# Patient Record
Sex: Male | Born: 1950
Health system: Southern US, Community
[De-identification: ages and names within clinical notes are randomized; demographics above are authoritative.]

## PROBLEM LIST (undated history)

## (undated) DIAGNOSIS — H269 Unspecified cataract: Secondary | ICD-10-CM

## (undated) DIAGNOSIS — M1990 Unspecified osteoarthritis, unspecified site: Secondary | ICD-10-CM

## (undated) DIAGNOSIS — C61 Malignant neoplasm of prostate: Secondary | ICD-10-CM

## (undated) DIAGNOSIS — E785 Hyperlipidemia, unspecified: Secondary | ICD-10-CM

## (undated) DIAGNOSIS — N390 Urinary tract infection, site not specified: Secondary | ICD-10-CM

## (undated) DIAGNOSIS — K219 Gastro-esophageal reflux disease without esophagitis: Secondary | ICD-10-CM

## (undated) DIAGNOSIS — C4490 Unspecified malignant neoplasm of skin, unspecified: Secondary | ICD-10-CM

## (undated) HISTORY — DX: Urinary tract infection, site not specified: N39.0

## (undated) HISTORY — PX: JOINT REPLACEMENT: SHX530

## (undated) HISTORY — DX: Unspecified cataract: H26.9

## (undated) HISTORY — PX: OTHER SURGICAL HISTORY: SHX169

## (undated) HISTORY — DX: Hyperlipidemia, unspecified: E78.5

## (undated) HISTORY — PX: REPLACEMENT TOTAL KNEE: SUR1224

## (undated) HISTORY — PX: EYE SURGERY: SHX253

---

## 2005-03-22 ENCOUNTER — Ambulatory Visit: Payer: Self-pay | Admitting: Family Medicine

## 2005-04-11 ENCOUNTER — Ambulatory Visit: Payer: Self-pay | Admitting: Family Medicine

## 2006-09-25 ENCOUNTER — Ambulatory Visit: Payer: Self-pay | Admitting: Family Medicine

## 2006-09-26 DIAGNOSIS — R05 Cough: Secondary | ICD-10-CM

## 2006-09-26 DIAGNOSIS — R059 Cough, unspecified: Secondary | ICD-10-CM | POA: Insufficient documentation

## 2007-06-02 ENCOUNTER — Ambulatory Visit: Payer: Self-pay | Admitting: Family Medicine

## 2007-06-02 LAB — CONVERTED CEMR LAB
ALT: 18 units/L (ref 0–53)
AST: 27 units/L (ref 0–37)
Albumin: 4.4 g/dL (ref 3.5–5.2)
Alkaline Phosphatase: 32 units/L — ABNORMAL LOW (ref 39–117)
BUN: 15 mg/dL (ref 6–23)
Basophils Relative: 0.4 % (ref 0.0–1.0)
CO2: 29 meq/L (ref 19–32)
Chloride: 102 meq/L (ref 96–112)
Creatinine, Ser: 0.9 mg/dL (ref 0.4–1.5)
Eosinophils Absolute: 0.2 10*3/uL (ref 0.0–0.7)
Eosinophils Relative: 4.8 % (ref 0.0–5.0)
GFR calc non Af Amer: 93 mL/min
Glucose, Bld: 99 mg/dL (ref 70–99)
Glucose, Urine, Semiquant: NEGATIVE
MCV: 93.2 fL (ref 78.0–100.0)
Monocytes Relative: 9.8 % (ref 3.0–12.0)
Neutrophils Relative %: 58.3 % (ref 43.0–77.0)
Nitrite: NEGATIVE
PSA: 1.03 ng/mL (ref 0.10–4.00)
Potassium: 5.2 meq/L — ABNORMAL HIGH (ref 3.5–5.1)
Protein, U semiquant: NEGATIVE
RBC: 4.73 M/uL (ref 4.22–5.81)
Specific Gravity, Urine: 1.01
Total CHOL/HDL Ratio: 3.6
Total Protein: 7 g/dL (ref 6.0–8.3)
VLDL: 14 mg/dL (ref 0–40)
WBC Urine, dipstick: NEGATIVE
WBC: 3.6 10*3/uL — ABNORMAL LOW (ref 4.5–10.5)
pH: 6.5

## 2007-06-10 ENCOUNTER — Ambulatory Visit: Payer: Self-pay | Admitting: Family Medicine

## 2008-02-20 HISTORY — PX: COLONOSCOPY: SHX174

## 2008-03-30 ENCOUNTER — Ambulatory Visit: Payer: Self-pay | Admitting: Gastroenterology

## 2008-04-21 ENCOUNTER — Ambulatory Visit: Payer: Self-pay | Admitting: Gastroenterology

## 2009-03-16 ENCOUNTER — Telehealth (INDEPENDENT_AMBULATORY_CARE_PROVIDER_SITE_OTHER): Payer: Self-pay | Admitting: *Deleted

## 2009-03-16 ENCOUNTER — Ambulatory Visit: Payer: Self-pay | Admitting: Cardiology

## 2009-03-16 ENCOUNTER — Ambulatory Visit: Payer: Self-pay | Admitting: Family Medicine

## 2009-03-16 DIAGNOSIS — R079 Chest pain, unspecified: Secondary | ICD-10-CM | POA: Insufficient documentation

## 2009-03-17 ENCOUNTER — Ambulatory Visit: Payer: Self-pay | Admitting: Cardiology

## 2009-03-17 ENCOUNTER — Encounter: Payer: Self-pay | Admitting: Internal Medicine

## 2009-03-17 ENCOUNTER — Encounter (HOSPITAL_COMMUNITY): Admission: RE | Admit: 2009-03-17 | Discharge: 2009-05-25 | Payer: Self-pay | Admitting: Cardiology

## 2009-03-17 ENCOUNTER — Ambulatory Visit: Payer: Self-pay

## 2010-03-22 NOTE — Assessment & Plan Note (Signed)
Summary: ? muscle strain chest from exercise?/dm   Vital Signs:  Patient profile:   60 year old male Weight:      176 pounds BMI:     25.16 Temp:     99.1 degrees F oral BP sitting:   110 / 80  (left arm) Cuff size:   regular  Vitals Entered By: Kern Reap CMA Duncan Dull) (March 16, 2009 8:29 AM)  Reason for Visit chest pain  History of Present Illness: Oscar Valencia is a 60 year old, married male, nonsmoker, who comes in today for evaluation of chest pain.  On December the 22nd he was walking playing golf.  On the second hole, which had a sharp incline.  He developed the gradual onset of sharp to a pressure-type midsternal chest pain.  He putted  out, rested for awhile.  The discomfort would not abate therefore, he walked back to the clubhouse.  He rested in the clubhouse for about 20 minutes and the discomfort went away.  He did have some nausea and diaphoresis.  No radiation.  he then went out and played 16 holes of golf walking with no chest pain  He had a second episode of couple weeks ago.  It is not recall the details of and a third episode this past Monday.  The episode on Monday occurred while he was sitting in the morning, having a bowel movement.  It was the same type of discomfort.  Sharp to dull midsternal nonradiating associate with some nausea and diaphoresis.  That episode lasted again 20 minutes and went away.  he exercises on a regular basis.  Last year.  He walked 200 rounds of golf.  He said no history of high blood pressure, diabetes, or lipid abnormalities.  Family history negative for coronary disease.  His mother died of melanoma.  His father is 30 has had hypertension, gallbladder removal, and esophageal stricture, older brother in good health.  Two younger sisters in good health.  He's also had difficulty swallowing.  He states in the morning.  He takes a baby aspirin a multivitamin and glucosamine.  He had to stop the glucosamine about 6 months ago because was getting  stuck in esophagus he had a similar episode to the above 25 years ago in [place], [place].  At that time, he was admitted the hospital for observation EKGs, serial enzymes were negative.  He was signed out as chest wall pain     Allergies: 1)  ! Penicillin  Past History:  Past medical, surgical, family and social histories (including risk factors) reviewed, and no changes noted (except as noted below).  Past Medical History: Reviewed history from 06/10/2007 and no changes required. Unremarkable  Family History: Reviewed history from 06/10/2007 and no changes required. father had a history of gallbladder disease, hypertension, and prostate cancer mother had a history of melanoma lymphoma, and a renal cell carcinoma.  One brother in good health.  Two sisters in good health  Social History: Reviewed history from 06/10/2007 and no changes required. Retired Married Never Smoked Alcohol use-no Drug use-no Regular exercise-yes  Review of Systems      See HPI  Physical Exam  General:  Well-developed,well-nourished,in no acute distress; alert,appropriate and cooperative throughout examination Neck:  No deformities, masses, or tenderness noted. Chest Wall:  No deformities, masses, tenderness or gynecomastia noted. Lungs:  Normal respiratory effort, chest expands symmetrically. Lungs are clear to auscultation, no crackles or wheezes. Heart:  Normal rate and regular rhythm. S1 and S2 normal without gallop,  murmur, click, rub or other extra sounds.   Impression & Recommendations:  Problem # 1:  CHEST PAIN, EXERTIONAL (ICD-786.50) Assessment New  Orders: Cardiology Referral (Cardiology)  Complete Medication List: 1)  Adult Aspirin Low Strength 81 Mg Tbdp (Aspirin) .... One by mouth daily 2)  Cvs Ibuprofen 200 Mg Caps (Ibuprofen) .... Prn  Patient Instructions: 1)  I talk with Dr. Leonard Schwartz...........Marland Kitchen be at his office at 1215 today. 2)  If your cardiac evaluation  is normal then  we will request a GI consult to evaluate the dysphasia  Appended Document: ? muscle strain chest from exercise?/dm     Allergies: 1)  ! Penicillin   Complete Medication List: 1)  Adult Aspirin Low Strength 81 Mg Tbdp (Aspirin) .... One by mouth daily 2)  Cvs Ibuprofen 200 Mg Caps (Ibuprofen) .... Prn  Other Orders: EKG w/ Interpretation (93000)

## 2010-03-22 NOTE — Assessment & Plan Note (Signed)
Summary: Cardiology Nuclear Study  Nuclear Med Background Indications for Stress Test: Evaluation for Ischemia     Symptoms: Chest Tightness, Chest Tightness with Exertion, Diaphoresis, Nausea    Nuclear Pre-Procedure Caffeine/Decaff Intake: None NPO After: 8:00 PM Lungs: clear IV 0.9% NS with Angio Cath: 22g     IV Site: (R) AC IV Started by: Irean Hong RN Chest Size (in) 41     Height (in): 70.25 Weight (lb): 170 BMI: 24.31  Nuclear Med Study 1 or 2 day study:  1 day     Stress Test Type:  Stress Reading MD:  Olga Millers, MD     Referring MD:  B.Brodie Resting Radionuclide:  Technetium 30m Tetrofosmin     Resting Radionuclide Dose:  10.8 mCi  Stress Radionuclide:  Technetium 64m Tetrofosmin     Stress Radionuclide Dose:  33.0 mCi   Stress Protocol Exercise Time (min):  4:00 min     Max HR:  141 bpm     Predicted Max HR:  163 bpm  Max Systolic BP: 206 mm Hg     Percent Max HR:  86.50 %     METS: 13.4 Rate Pressure Product:  16109    Stress Test Technologist:  Milana Na EMT-P     Nuclear Technologist:  Burna Mortimer Deal RT-N  Rest Procedure  Myocardial perfusion imaging was performed at rest 45 minutes following the intravenous administration of Myoview Technetium 48m Tetrofosmin.  Stress Procedure  The patient exercised for 11:00. The patient stopped due to fatigue and denied any chest pain.  There were non specific T wave changes.  Myoview was injected at peak exercise and myocardial perfusion imaging was performed after a brief delay.  QPS Raw Data Images:  There is no interference from other nuclear activity. Stress Images:  There is normal uptake in all areas. Rest Images:  Normal homogeneous uptake in all areas of the myocardium. Subtraction (SDS):  No evidence of ischemia. Transient Ischemic Dilatation:  .93  (Normal <1.22)  Lung/Heart Ratio:  .35  (Normal <0.45)  Quantitative Gated Spect Images QGS EDV:  125 ml QGS ESV:  61 ml QGS EF:  51 % QGS cine  images:  Normal wall motion; mild LVE.   Overall Impression  Exercise Capacity: Excellent exercise capacity. BP Response: Normal blood pressure response. Clinical Symptoms: No chest pain ECG Impression: No significant ST segment change suggestive of ischemia. Overall Impression: There is no sign of scar or ischemia.  Appended Document: Cardiology Nuclear Beather Arbour, I called him and told him ok. He will f/u with Dr Tawanna Cooler. BB

## 2010-03-22 NOTE — Progress Notes (Signed)
Summary: Nuclear pre procedure  Phone Note Outgoing Call Call back at [facility] Phone [phone]   Call placed by: Rea College, CMA,  March 16, 2009 2:48 PM Call placed to: Patient Summary of Call: Reviewed information on Myoview Information Sheet (see scanned document for further details).  Spoke with patient.      Nuclear Med Background Indications for Stress Test: Evaluation for Ischemia     Symptoms: Chest Tightness, Chest Tightness with Exertion, Diaphoresis, Nausea    Nuclear Pre-Procedure Height (in): 70.25

## 2010-03-22 NOTE — Assessment & Plan Note (Signed)
Summary: np6   Visit Type:  Initial Consult Primary Provider:  Roderick Pee MD  CC:  chest pain.  History of Present Illness: The patient is 60 years old and is referred by Dr. Tawanna Cooler for evaluation of chest pain. He has had 3 episodes of chest pain over the past one to 2 months. One of his episodes occurred while he was walking up a hill playing golf. He described the pain as a tightness in his chest and this became worse and was associated with some nausea and diaphoresis. He had to stop playing golf and go back to the clubhouse and the symptoms finally resolved. He had another episode earlier this week where he woke up and felt tightness in his chest with a feeling of clamminess.  He saw his primary care physician Dr. Tawanna Cooler today who was concerned that his symptoms may represent angina and referred him urgently for consultation.  He has no history hypertension or diabetes.  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 One By Mouth Daily 2)  Cvs Ibuprofen 200 Mg  Caps (Ibuprofen) .... Prn  Allergies (verified): 1)  ! Penicillin  Past History:  Past Medical History: Reviewed history from 06/10/2007 and no changes required. Unremarkable  Family History: Reviewed history from 06/10/2007 and no changes required. father had a history of gallbladder disease, hypertension, and prostate cancer mother had a history of melanoma lymphoma, and a renal cell carcinoma.  One brother in good health.  Two sisters in good health  Social History: Reviewed history from 06/10/2007 and no changes required. Retired Married Never Smoked Alcohol use-no Drug use-no Regular exercise-yes  Review of Systems       ROS is negative except as outlined in HPI.   Vital Signs:  Patient profile:   60 year old male Height:      70.25  inches Weight:      175 pounds BMI:     25.02 Pulse rate:   52 / minute BP sitting:   118 / 76  (left arm) Cuff size:   regular  Vitals Entered By: Burnett Kanaris, CNA (March 16, 2009 12:26 PM)  Physical Exam  Additional Exam:  Gen. Well-nourished, in no distress Head: Normocephalic    Eyes: PERRLA/EOM intact; conjunctiva and lids normal Neck: No JVD, thyroid not enlarged, no carotid bruits Lungs: No tachypnea, clear w/o rales, rhonchi or wheezes CV: Rhythm regular, PMI not displaced,  sounds  nl, no murmurs or gallops, no edema, pulses nl UE and LE Abd: BS nl, abd soft and non-tender w/o masses or hepatosplenomegaly MS: No deformities Neuro: no focal sns Skin: no lesions Psych: nl affect    Impression & Recommendations:  Problem # 1:  CHEST PAIN, EXERTIONAL (ICD-786.50) The symptoms of chest pain are a little worrisome for ischemia although they have not been consistent. Some of them have been related to exertion and some have not. He said other times where he's had exertion and no symptoms. His risk profile is not high. He has no history of hypertension diabetes or hyperlipidemia or family history of coronary heart disease. However his symptoms are worrisome enough that I think he should have further evaluation. We have arranged for him to have an exercise rest rest Myoview scan.  If the scan is positive then he'll need further evaluation. If the  scan is negative and probably refer him back to [facility] for consideration for treatment of possible reflux versus musculoskeletal pain. Orders: Nuclear Stress Test (Nuc Stress Test)  Patient Instructions: 1)  Your physician has requested that you have an exercise stress myoview.  For further information please visit [email or web address]  Please follow instruction sheet, as given. 2)  We will see you back on an as needed basis as long as your stress test is normal.  3)  Hold aspirin and ibuprofen for now.  Prevention & Chronic  Care Immunizations   Influenza vaccine: Not documented    Tetanus booster: Not documented    Pneumococcal vaccine: Not documented  Colorectal Screening   Hemoccult: Not documented    Colonoscopy: Location:  [facility].    (04/21/2008)   Colonoscopy due: 04/2018  Other Screening   PSA: 1.03  (06/02/2007)   Smoking status: never  (06/10/2007)  Lipids   Total Cholesterol: 199  (06/02/2007)   LDL: 130  (06/02/2007)   LDL Direct: Not documented   HDL: 54.6  (06/02/2007)   Triglycerides: 70  (06/02/2007)

## 2015-05-17 ENCOUNTER — Encounter: Payer: Self-pay | Admitting: Gastroenterology

## 2016-06-22 DIAGNOSIS — E784 Other hyperlipidemia: Secondary | ICD-10-CM | POA: Diagnosis not present

## 2016-07-05 DIAGNOSIS — M179 Osteoarthritis of knee, unspecified: Secondary | ICD-10-CM | POA: Diagnosis not present

## 2016-07-05 DIAGNOSIS — Z0000 Encounter for general adult medical examination without abnormal findings: Secondary | ICD-10-CM | POA: Diagnosis not present

## 2016-07-05 DIAGNOSIS — R030 Elevated blood-pressure reading, without diagnosis of hypertension: Secondary | ICD-10-CM | POA: Diagnosis not present

## 2016-07-05 DIAGNOSIS — E784 Other hyperlipidemia: Secondary | ICD-10-CM | POA: Diagnosis not present

## 2016-07-06 ENCOUNTER — Other Ambulatory Visit: Payer: Self-pay | Admitting: Internal Medicine

## 2016-07-06 DIAGNOSIS — Z87891 Personal history of nicotine dependence: Secondary | ICD-10-CM

## 2016-07-10 DIAGNOSIS — Z1212 Encounter for screening for malignant neoplasm of rectum: Secondary | ICD-10-CM | POA: Diagnosis not present

## 2016-07-17 ENCOUNTER — Ambulatory Visit: Payer: Self-pay

## 2016-07-18 DIAGNOSIS — H5213 Myopia, bilateral: Secondary | ICD-10-CM | POA: Diagnosis not present

## 2016-07-19 ENCOUNTER — Ambulatory Visit
Admission: RE | Admit: 2016-07-19 | Discharge: 2016-07-19 | Disposition: A | Discharge status: Home / Self Care | Payer: Medicare Other | Source: Ambulatory Visit | Attending: Internal Medicine | Admitting: Internal Medicine

## 2016-07-19 DIAGNOSIS — Z87891 Personal history of nicotine dependence: Secondary | ICD-10-CM | POA: Diagnosis not present

## 2016-07-19 DIAGNOSIS — Z136 Encounter for screening for cardiovascular disorders: Secondary | ICD-10-CM | POA: Diagnosis not present

## 2016-12-15 DIAGNOSIS — Z23 Encounter for immunization: Secondary | ICD-10-CM | POA: Diagnosis not present

## 2016-12-25 DIAGNOSIS — M25562 Pain in left knee: Secondary | ICD-10-CM | POA: Diagnosis not present

## 2016-12-25 DIAGNOSIS — M25561 Pain in right knee: Secondary | ICD-10-CM | POA: Diagnosis not present

## 2016-12-25 DIAGNOSIS — M1712 Unilateral primary osteoarthritis, left knee: Secondary | ICD-10-CM | POA: Diagnosis not present

## 2017-01-02 DIAGNOSIS — M79645 Pain in left finger(s): Secondary | ICD-10-CM | POA: Diagnosis not present

## 2017-01-02 DIAGNOSIS — S62635A Displaced fracture of distal phalanx of left ring finger, initial encounter for closed fracture: Secondary | ICD-10-CM | POA: Diagnosis not present

## 2017-02-04 DIAGNOSIS — M25562 Pain in left knee: Secondary | ICD-10-CM | POA: Diagnosis not present

## 2017-02-04 DIAGNOSIS — M1712 Unilateral primary osteoarthritis, left knee: Secondary | ICD-10-CM | POA: Diagnosis not present

## 2017-03-04 DIAGNOSIS — D1801 Hemangioma of skin and subcutaneous tissue: Secondary | ICD-10-CM | POA: Diagnosis not present

## 2017-03-04 DIAGNOSIS — L821 Other seborrheic keratosis: Secondary | ICD-10-CM | POA: Diagnosis not present

## 2017-03-04 DIAGNOSIS — L814 Other melanin hyperpigmentation: Secondary | ICD-10-CM | POA: Diagnosis not present

## 2017-03-04 DIAGNOSIS — Z85828 Personal history of other malignant neoplasm of skin: Secondary | ICD-10-CM | POA: Diagnosis not present

## 2017-03-12 DIAGNOSIS — M19042 Primary osteoarthritis, left hand: Secondary | ICD-10-CM | POA: Diagnosis not present

## 2017-03-12 DIAGNOSIS — S62635K Displaced fracture of distal phalanx of left ring finger, subsequent encounter for fracture with nonunion: Secondary | ICD-10-CM | POA: Diagnosis not present

## 2017-03-27 DIAGNOSIS — S62635A Displaced fracture of distal phalanx of left ring finger, initial encounter for closed fracture: Secondary | ICD-10-CM | POA: Diagnosis not present

## 2017-03-27 DIAGNOSIS — S62635D Displaced fracture of distal phalanx of left ring finger, subsequent encounter for fracture with routine healing: Secondary | ICD-10-CM | POA: Diagnosis not present

## 2017-03-27 DIAGNOSIS — Z4789 Encounter for other orthopedic aftercare: Secondary | ICD-10-CM | POA: Diagnosis not present

## 2017-03-27 DIAGNOSIS — M25642 Stiffness of left hand, not elsewhere classified: Secondary | ICD-10-CM | POA: Diagnosis not present

## 2017-04-01 DIAGNOSIS — M25562 Pain in left knee: Secondary | ICD-10-CM | POA: Diagnosis not present

## 2017-04-01 DIAGNOSIS — M1712 Unilateral primary osteoarthritis, left knee: Secondary | ICD-10-CM | POA: Diagnosis not present

## 2017-04-10 DIAGNOSIS — Z4789 Encounter for other orthopedic aftercare: Secondary | ICD-10-CM | POA: Diagnosis not present

## 2017-04-10 DIAGNOSIS — S62635A Displaced fracture of distal phalanx of left ring finger, initial encounter for closed fracture: Secondary | ICD-10-CM | POA: Diagnosis not present

## 2017-05-08 DIAGNOSIS — M25562 Pain in left knee: Secondary | ICD-10-CM | POA: Diagnosis not present

## 2017-05-08 DIAGNOSIS — M1712 Unilateral primary osteoarthritis, left knee: Secondary | ICD-10-CM | POA: Diagnosis not present

## 2017-05-09 DIAGNOSIS — S62635D Displaced fracture of distal phalanx of left ring finger, subsequent encounter for fracture with routine healing: Secondary | ICD-10-CM | POA: Diagnosis not present

## 2017-05-09 DIAGNOSIS — Z4789 Encounter for other orthopedic aftercare: Secondary | ICD-10-CM | POA: Diagnosis not present

## 2017-05-15 DIAGNOSIS — M1712 Unilateral primary osteoarthritis, left knee: Secondary | ICD-10-CM | POA: Diagnosis not present

## 2017-06-24 DIAGNOSIS — H5213 Myopia, bilateral: Secondary | ICD-10-CM | POA: Diagnosis not present

## 2017-06-27 DIAGNOSIS — M1712 Unilateral primary osteoarthritis, left knee: Secondary | ICD-10-CM | POA: Diagnosis not present

## 2017-07-08 DIAGNOSIS — G8918 Other acute postprocedural pain: Secondary | ICD-10-CM | POA: Diagnosis not present

## 2017-07-08 DIAGNOSIS — M1712 Unilateral primary osteoarthritis, left knee: Secondary | ICD-10-CM | POA: Diagnosis not present

## 2017-07-11 DIAGNOSIS — M25562 Pain in left knee: Secondary | ICD-10-CM | POA: Diagnosis not present

## 2017-07-12 DIAGNOSIS — M25562 Pain in left knee: Secondary | ICD-10-CM | POA: Diagnosis not present

## 2017-07-16 DIAGNOSIS — M25562 Pain in left knee: Secondary | ICD-10-CM | POA: Diagnosis not present

## 2017-07-19 DIAGNOSIS — M25562 Pain in left knee: Secondary | ICD-10-CM | POA: Diagnosis not present

## 2017-07-22 DIAGNOSIS — M25562 Pain in left knee: Secondary | ICD-10-CM | POA: Diagnosis not present

## 2017-07-26 DIAGNOSIS — M25562 Pain in left knee: Secondary | ICD-10-CM | POA: Diagnosis not present

## 2017-07-26 DIAGNOSIS — Z125 Encounter for screening for malignant neoplasm of prostate: Secondary | ICD-10-CM | POA: Diagnosis not present

## 2017-07-26 DIAGNOSIS — R82998 Other abnormal findings in urine: Secondary | ICD-10-CM | POA: Diagnosis not present

## 2017-07-26 DIAGNOSIS — E7849 Other hyperlipidemia: Secondary | ICD-10-CM | POA: Diagnosis not present

## 2017-07-31 DIAGNOSIS — E7849 Other hyperlipidemia: Secondary | ICD-10-CM | POA: Diagnosis not present

## 2017-08-06 DIAGNOSIS — M1712 Unilateral primary osteoarthritis, left knee: Secondary | ICD-10-CM | POA: Diagnosis not present

## 2017-08-06 DIAGNOSIS — R269 Unspecified abnormalities of gait and mobility: Secondary | ICD-10-CM | POA: Diagnosis not present

## 2017-08-06 DIAGNOSIS — M6281 Muscle weakness (generalized): Secondary | ICD-10-CM | POA: Diagnosis not present

## 2017-08-13 DIAGNOSIS — M25562 Pain in left knee: Secondary | ICD-10-CM | POA: Diagnosis not present

## 2017-08-15 DIAGNOSIS — Z0000 Encounter for general adult medical examination without abnormal findings: Secondary | ICD-10-CM | POA: Diagnosis not present

## 2017-08-15 DIAGNOSIS — R030 Elevated blood-pressure reading, without diagnosis of hypertension: Secondary | ICD-10-CM | POA: Diagnosis not present

## 2017-08-15 DIAGNOSIS — R972 Elevated prostate specific antigen [PSA]: Secondary | ICD-10-CM | POA: Diagnosis not present

## 2017-08-15 DIAGNOSIS — E7849 Other hyperlipidemia: Secondary | ICD-10-CM | POA: Diagnosis not present

## 2017-08-16 DIAGNOSIS — Z1212 Encounter for screening for malignant neoplasm of rectum: Secondary | ICD-10-CM | POA: Diagnosis not present

## 2017-08-16 DIAGNOSIS — Z96652 Presence of left artificial knee joint: Secondary | ICD-10-CM | POA: Diagnosis not present

## 2017-08-16 DIAGNOSIS — Z471 Aftercare following joint replacement surgery: Secondary | ICD-10-CM | POA: Diagnosis not present

## 2017-10-01 DIAGNOSIS — R972 Elevated prostate specific antigen [PSA]: Secondary | ICD-10-CM | POA: Diagnosis not present

## 2017-10-01 DIAGNOSIS — R791 Abnormal coagulation profile: Secondary | ICD-10-CM | POA: Diagnosis not present

## 2017-12-17 DIAGNOSIS — Z23 Encounter for immunization: Secondary | ICD-10-CM | POA: Diagnosis not present

## 2018-01-21 DIAGNOSIS — R972 Elevated prostate specific antigen [PSA]: Secondary | ICD-10-CM | POA: Diagnosis not present

## 2018-03-10 DIAGNOSIS — L570 Actinic keratosis: Secondary | ICD-10-CM | POA: Diagnosis not present

## 2018-03-10 DIAGNOSIS — D1801 Hemangioma of skin and subcutaneous tissue: Secondary | ICD-10-CM | POA: Diagnosis not present

## 2018-03-10 DIAGNOSIS — L821 Other seborrheic keratosis: Secondary | ICD-10-CM | POA: Diagnosis not present

## 2018-03-10 DIAGNOSIS — Z85828 Personal history of other malignant neoplasm of skin: Secondary | ICD-10-CM | POA: Diagnosis not present

## 2018-05-09 ENCOUNTER — Encounter: Payer: Self-pay | Admitting: Gastroenterology

## 2018-07-16 DIAGNOSIS — H5213 Myopia, bilateral: Secondary | ICD-10-CM | POA: Diagnosis not present

## 2018-11-13 DIAGNOSIS — Z23 Encounter for immunization: Secondary | ICD-10-CM | POA: Diagnosis not present

## 2018-12-23 DIAGNOSIS — E7849 Other hyperlipidemia: Secondary | ICD-10-CM | POA: Diagnosis not present

## 2018-12-23 DIAGNOSIS — Z125 Encounter for screening for malignant neoplasm of prostate: Secondary | ICD-10-CM | POA: Diagnosis not present

## 2018-12-26 DIAGNOSIS — R82998 Other abnormal findings in urine: Secondary | ICD-10-CM | POA: Diagnosis not present

## 2018-12-29 DIAGNOSIS — Z1212 Encounter for screening for malignant neoplasm of rectum: Secondary | ICD-10-CM | POA: Diagnosis not present

## 2019-02-04 DIAGNOSIS — M1711 Unilateral primary osteoarthritis, right knee: Secondary | ICD-10-CM | POA: Diagnosis not present

## 2019-02-04 DIAGNOSIS — M25561 Pain in right knee: Secondary | ICD-10-CM | POA: Diagnosis not present

## 2019-03-10 ENCOUNTER — Ambulatory Visit: Payer: Medicare Other | Attending: Internal Medicine

## 2019-03-10 DIAGNOSIS — Z23 Encounter for immunization: Secondary | ICD-10-CM

## 2019-03-10 NOTE — Progress Notes (Signed)
   Covid-19 Vaccination Clinic  Name:  Oscar Valencia    MRN: [id number] DOB: 1950-08-20  03/10/2019  Mr. Oscar Valencia was observed post Covid-19 immunization for 15 minutes without incidence. He was provided with Vaccine Information Sheet and instruction to access the V-Safe system.   Mr. Oscar Valencia was instructed to call 911 with any severe reactions post vaccine: Marland Kitchen Difficulty breathing  . Swelling of your face and throat  . A fast heartbeat  . A bad rash all over your body  . Dizziness and weakness    Immunizations Administered    Name Date Dose VIS Date Route   Pfizer COVID-19 Vaccine 03/10/2019  3:13 PM 0.3 mL 01/30/2019 Intramuscular   Manufacturer: [place]   Lot: [id number]   NDC: [id number]

## 2019-03-16 DIAGNOSIS — L821 Other seborrheic keratosis: Secondary | ICD-10-CM | POA: Diagnosis not present

## 2019-03-16 DIAGNOSIS — L570 Actinic keratosis: Secondary | ICD-10-CM | POA: Diagnosis not present

## 2019-03-16 DIAGNOSIS — D1801 Hemangioma of skin and subcutaneous tissue: Secondary | ICD-10-CM | POA: Diagnosis not present

## 2019-03-16 DIAGNOSIS — Z85828 Personal history of other malignant neoplasm of skin: Secondary | ICD-10-CM | POA: Diagnosis not present

## 2019-03-27 ENCOUNTER — Ambulatory Visit: Payer: Medicare Other

## 2019-03-30 ENCOUNTER — Ambulatory Visit: Payer: Medicare Other | Attending: Internal Medicine

## 2019-03-30 DIAGNOSIS — Z23 Encounter for immunization: Secondary | ICD-10-CM

## 2019-03-30 NOTE — Progress Notes (Signed)
   Covid-19 Vaccination Clinic  Name:  DECATUR SEGREST    MRN: [id number] DOB: [birth year]  03/30/2019  Mr. Neidich was observed post Covid-19 immunization for 15 minutes without incidence. He was provided with Vaccine Information Sheet and instruction to access the V-Safe system.   Mr. Ellett was instructed to call 911 with any severe reactions post vaccine: Marland Kitchen Difficulty breathing  . Swelling of your face and throat  . A fast heartbeat  . A bad rash all over your body  . Dizziness and weakness    Immunizations Administered    Name Date Dose VIS Date Route   Pfizer COVID-19 Vaccine 03/30/2019  5:34 PM 0.3 mL 01/30/2019 Intramuscular   Manufacturer: [place]   Lot: [id number]   [place]: [id number]

## 2019-04-17 DIAGNOSIS — N402 Nodular prostate without lower urinary tract symptoms: Secondary | ICD-10-CM | POA: Diagnosis not present

## 2019-04-17 DIAGNOSIS — R972 Elevated prostate specific antigen [PSA]: Secondary | ICD-10-CM | POA: Diagnosis not present

## 2019-04-24 ENCOUNTER — Ambulatory Visit: Payer: Medicare Other

## 2019-04-30 DIAGNOSIS — C61 Malignant neoplasm of prostate: Secondary | ICD-10-CM | POA: Diagnosis not present

## 2019-04-30 DIAGNOSIS — R972 Elevated prostate specific antigen [PSA]: Secondary | ICD-10-CM | POA: Diagnosis not present

## 2019-04-30 HISTORY — PX: PROSTATE BIOPSY: SHX241

## 2019-05-03 DIAGNOSIS — Z20828 Contact with and (suspected) exposure to other viral communicable diseases: Secondary | ICD-10-CM | POA: Diagnosis not present

## 2019-05-06 DIAGNOSIS — C61 Malignant neoplasm of prostate: Secondary | ICD-10-CM | POA: Diagnosis not present

## 2019-05-06 DIAGNOSIS — N410 Acute prostatitis: Secondary | ICD-10-CM | POA: Diagnosis not present

## 2019-05-26 ENCOUNTER — Encounter: Payer: Self-pay | Admitting: Gastroenterology

## 2019-05-26 DIAGNOSIS — C61 Malignant neoplasm of prostate: Secondary | ICD-10-CM | POA: Diagnosis not present

## 2019-05-26 DIAGNOSIS — N410 Acute prostatitis: Secondary | ICD-10-CM | POA: Diagnosis not present

## 2019-06-01 ENCOUNTER — Other Ambulatory Visit: Payer: Self-pay

## 2019-06-01 ENCOUNTER — Ambulatory Visit (AMBULATORY_SURGERY_CENTER): Payer: Self-pay | Admitting: *Deleted

## 2019-06-01 VITALS — Temp 97.5°F | Ht 69.0 in | Wt 169.4 lb

## 2019-06-01 DIAGNOSIS — Z1211 Encounter for screening for malignant neoplasm of colon: Secondary | ICD-10-CM

## 2019-06-01 MED ORDER — SUTAB 1479-225-188 MG PO TABS: 1.0000 | ORAL_TABLET | ORAL | 0 refills | Status: DC

## 2019-06-01 NOTE — Progress Notes (Signed)
Patient denies any allergies to egg or soy products. Patient denies complications with anesthesia/sedation.  Patient denies oxygen use at home and denies diet medications.  Sutab coupon given at [facility] appt.

## 2019-06-08 DIAGNOSIS — R8279 Other abnormal findings on microbiological examination of urine: Secondary | ICD-10-CM | POA: Diagnosis not present

## 2019-06-08 DIAGNOSIS — N410 Acute prostatitis: Secondary | ICD-10-CM | POA: Diagnosis not present

## 2019-06-10 NOTE — Progress Notes (Signed)
GU Location of Tumor / Histology: prostatic adenocarcinoma  If Prostate Cancer, Gleason Score is (4 + 3) and PSA is (5.24). Prostate volume: 29.2 grams.  Oscar Valencia presented with an elevated PSA of 5.24 and right lateral induration of the prostate. Also, patient father has a hx of prostate cancer (alive at 24).  Biopsies of prostate (if applicable) revealed:   Past/Anticipated interventions by urology, if any: prostate biopsy, referral to Dr. Tammi Klippel to discuss radiotherapy options. Per [name]'s note patient is leaning more toward nerve sparing prostatectomy.   Past/Anticipated interventions by medical oncology, if any: no  Weight changes, if any: denies  Bowel/Bladder complaints, if any: Reports good erectile function. SHIM 22. IPSS 8 related to urinary tract infection. Reports his symptoms associated with his UTI are slowly resolving with antibiotics. Denies dysuria, hematuria, urinary leakage or incontinence. Denies any bowel complaints.    Nausea/Vomiting, if any: denies  Pain issues, if any:  denies  SAFETY ISSUES:  Prior radiation? denies  Pacemaker/ICD? denies  Possible current pregnancy? No, male patient  Is the patient on methotrexate? denies  Current Complaints / other details:  69 year old male. Married. 2 daughter and 1 son. Trying to decide between a nerve sparing prostatectomy and radiation therapy.

## 2019-06-12 ENCOUNTER — Ambulatory Visit
Admission: RE | Admit: 2019-06-12 | Discharge: 2019-06-12 | Disposition: A | Discharge status: Home / Self Care | Payer: Medicare Other | Source: Ambulatory Visit | Attending: Radiation Oncology | Admitting: Radiation Oncology

## 2019-06-12 ENCOUNTER — Other Ambulatory Visit: Payer: Self-pay

## 2019-06-12 ENCOUNTER — Encounter: Payer: Self-pay | Admitting: Radiation Oncology

## 2019-06-12 VITALS — Ht 70.0 in | Wt 164.0 lb

## 2019-06-12 DIAGNOSIS — C61 Malignant neoplasm of prostate: Secondary | ICD-10-CM | POA: Insufficient documentation

## 2019-06-12 DIAGNOSIS — R972 Elevated prostate specific antigen [PSA]: Secondary | ICD-10-CM | POA: Diagnosis not present

## 2019-06-12 DIAGNOSIS — Z8042 Family history of malignant neoplasm of prostate: Secondary | ICD-10-CM | POA: Diagnosis not present

## 2019-06-12 HISTORY — DX: Malignant neoplasm of prostate: C61

## 2019-06-12 HISTORY — DX: Unspecified malignant neoplasm of skin, unspecified: C44.90

## 2019-06-12 NOTE — Progress Notes (Signed)
See progress note under physician encounter. 

## 2019-06-12 NOTE — Progress Notes (Signed)
Radiation Oncology         [phone] ________________________________  Initial Outpatient Consultation - Conducted via Telephone due to current COVID-19 concerns for limiting patient exposure  Name: Oscar Valencia MRN: [id number]  Date: 06/12/2019  DOB: 1950-08-14  ZD:GUYQ, Gwyndolyn Saxon, MD  Raynelle Bring, MD   REFERRING PHYSICIAN: Raynelle Bring, MD  DIAGNOSIS: 69 y.o. gentleman with Stage T2b adenocarcinoma of the prostate with Gleason score of 4+3, and PSA of 5.24.    ICD-10-CM   1. Malignant neoplasm of prostate ([place])  C61     HISTORY OF PRESENT ILLNESS: Oscar Valencia is a 69 y.o. male with a diagnosis of prostate cancer. He was noted to have an elevated PSA of 5.24 by his primary care physician, Dr. Brigitte Pulse.  Accordingly, he was referred for evaluation in urology by Dr. Alinda Money on 04/17/2019,  digital rectal examination was performed at that time revealing right lateral base induration.  The patient proceeded to transrectal ultrasound with 12 biopsies of the prostate on 04/30/2019.  The prostate volume measured 29.2 cc.  Out of 12 core biopsies, 5 were positive.  The maximum Gleason score was 4+3, and this was seen in the right base lateral (with PNI).  Additionally, Gleason 3+4 was seen in the right apex lateral and left apex and Gleason 3+3 was seen in the right mid (small focus) and right mid lateral.  Following the biopsy, he developed a fever and was found to have a UTI/prostatitis treated with a 2 week course of antibiotic, which he reports did significantly improve his urinary symptoms initially.  More recently, he developed a return of increased frequency, urgency and dysuria so he presented back to the urology office on 06/08/19 and was again found to have UTI. He started Doxycyline BID and plans to complete a full 3 week course of treatment. He will follow up with Urology at the completion of treatment and if infection persists, will have prostate U/S to r/o prostate abccess at that time.   The patient reviewed the biopsy results with his urologist and he has kindly been referred today for discussion of potential radiation treatment options.   PREVIOUS RADIATION THERAPY: No  PAST MEDICAL HISTORY:  Past Medical History:  Diagnosis Date  . Cataract    surgery to remove  . Hyperlipidemia   . Prostate cancer ([place])   . Skin cancer       PAST SURGICAL HISTORY: Past Surgical History:  Procedure Laterality Date  . cataracts Bilateral    eye surgery  . COLONOSCOPY  2010   mild tics, no polyps  . left hand surgery     ring finger - repaired tendon  . PROSTATE BIOPSY  04/30/2019   cancerous  . REPLACEMENT TOTAL KNEE Left     FAMILY HISTORY:  Family History  Problem Relation Age of Onset  . Colon polyps Father   . Prostate cancer Father   . Skin cancer Mother   . Colon cancer Neg Hx   . Rectal cancer Neg Hx   . Stomach cancer Neg Hx   . Breast cancer Neg Hx     SOCIAL HISTORY:  Social History   Socioeconomic History  . Marital status: Married    Spouse name: Not on file  . Number of children: Not on file  . Years of education: Not on file  . Highest education level: Not on file  Occupational History  . Not on file  Tobacco Use  . Smoking status: Former Smoker  Packs/day: 0.50    Years: 10.00    Pack years: 5.00    Types: Cigarettes    Quit date: 58    Years since quitting: 28.3  . Smokeless tobacco: Never Used  Substance and Sexual Activity  . Alcohol use: Yes    Alcohol/week: 12.0 standard drinks    Types: 12 Standard drinks or equivalent per week    Comment: beer / wine  . Drug use: Never  . Sexual activity: Yes  Other Topics Concern  . Not on file  Social History Narrative  . Not on file   Social Determinants of Health   Financial Resource Strain:   . Difficulty of Paying Living Expenses:   Food Insecurity:   . Worried About Charity fundraiser in the Last Year:   . Arboriculturist in the Last Year:   Transportation Needs:   .  Film/video editor (Medical):   Marland Kitchen Lack of Transportation (Non-Medical):   Physical Activity:   . Days of Exercise per Week:   . Minutes of Exercise per Session:   Stress:   . Feeling of Stress :   Social Connections:   . Frequency of Communication with Friends and Family:   . Frequency of Social Gatherings with Friends and Family:   . Attends Religious Services:   . Active Member of Clubs or Organizations:   . Attends Archivist Meetings:   Marland Kitchen Marital Status:   Intimate Partner Violence:   . Fear of Current or Ex-Partner:   . Emotionally Abused:   Marland Kitchen Physically Abused:   . Sexually Abused:     ALLERGIES: Penicillins  MEDICATIONS:  Current Outpatient Medications  Medication Sig Dispense Refill  . atorvastatin (LIPITOR) 20 MG tablet Take 20 mg by mouth at bedtime.    Marland Kitchen doxycycline (VIBRAMYCIN) 100 MG capsule Take 100 mg by mouth every 12 (twelve) hours.    Marland Kitchen amoxicillin (AMOXIL) 500 MG tablet Take 2,000 mg by mouth as needed (Prior to dental / surgical procedures - knee replacement).     . Sodium Sulfate-Mag Sulfate-KCl (SUTAB) [phone] MG TABS Take 1 kit by mouth as directed. BIN: [id number] PCN: CN GROUP: [id number] MEMBER ID: [id number];TXM AS CASH (Patient not taking: Reported on 06/12/2019) 24 tablet 0   No current facility-administered medications for this encounter.    REVIEW OF SYSTEMS:  On review of systems, the patient reports that he is doing well overall. He denies any chest pain, shortness of breath, cough, fevers, chills, night sweats, unintended weight changes. He denies any bowel disturbances, and denies abdominal pain, nausea or vomiting. He denies any new musculoskeletal or joint aches or pains. His IPSS was 8, indicating moderate urinary symptoms. These are mainly related to his current UTI. He denied any major symptoms at baseline. His SHIM was 22, indicating he does not have erectile dysfunction. A complete review of systems is obtained and is otherwise  negative.    PHYSICAL EXAM:  Wt Readings from Last 3 Encounters:  06/12/19 164 lb (74.4 kg)  06/01/19 169 lb 6.4 oz (76.8 kg)   Temp Readings from Last 3 Encounters:  06/01/19 (!) 97.5 F (36.4 C) (Temporal)   BP Readings from Last 3 Encounters:  No data found for BP   Pulse Readings from Last 3 Encounters:  No data found for Pulse   Pain Assessment Pain Score: 0-No pain/10  Physical exam not performed in light of telephone consult visit format.   KPS = 100  100 -  Normal; no complaints; no evidence of disease. 90   - Able to carry on normal activity; minor signs or symptoms of disease. 80   - Normal activity with effort; some signs or symptoms of disease. 73   - Cares for self; unable to carry on normal activity or to do active work. 60   - Requires occasional assistance, but is able to care for most of his personal needs. 50   - Requires considerable assistance and frequent medical care. 2   - Disabled; requires special care and assistance. 63   - Severely disabled; hospital admission is indicated although death not imminent. 106   - Very sick; hospital admission necessary; active supportive treatment necessary. 10   - Moribund; fatal processes progressing rapidly. 0     - Dead  Karnofsky DA, Abelmann Kimball, Craver LS and Burchenal [facility] [phone]) The use of the nitrogen mustards in the palliative treatment of carcinoma: with particular reference to bronchogenic carcinoma Cancer 1 634-56  LABORATORY DATA:  Lab Results  Component Value Date   WBC 3.6 (L) 06/02/2007   HGB 15.1 06/02/2007   HCT 44.1 06/02/2007   MCV 93.2 06/02/2007   PLT 199 06/02/2007   Lab Results  Component Value Date   NA 140 06/02/2007   K 5.2 (H) 06/02/2007   CL 102 06/02/2007   CO2 29 06/02/2007   Lab Results  Component Value Date   ALT 18 06/02/2007   AST 27 06/02/2007   ALKPHOS 32 (L) 06/02/2007   BILITOT 1.2 06/02/2007     RADIOGRAPHY: No results found.    IMPRESSION/PLAN: This visit  was conducted via Telephone to spare the patient unnecessary potential exposure in the healthcare setting during the current COVID-19 pandemic. 1. 69 y.o. gentleman with Stage T2b adenocarcinoma of the prostate with Gleason Score of 4+3, and PSA of 5.24. We discussed the patient's workup and outlined the nature of prostate cancer in this setting. The patient's T stage, Gleason's score, and PSA put him into the unfavorable intermediate risk group. Accordingly, he is eligible for a variety of potential treatment options including brachytherapy, 5.5 weeks of external radiation, or prostatectomy. We discussed the available radiation techniques, and focused on the details and logistics of delivery. We discussed and outlined the risks, benefits, short and long-term effects associated with radiotherapy and compared and contrasted these with prostatectomy. We discussed the role of SpaceOAR in reducing the rectal toxicity associated with radiotherapy.   At the end of the conversation, the patient remains undecided regarding his final treatment preference but appears to still be leaning towards prostatectomy at this point.  He is also giving some consideration to brachytherapy and would like to take some additional time to consider his options and prefers to meet back with Dr. Alinda Money for further discussion prior to committing to treatment. We will share our discussion with Dr. Alinda Money and look forward to following along in his care.  Given current concerns for patient exposure during the COVID-19 pandemic, this encounter was conducted via telephone. The patient was notified in advance and was offered a MyChart meeting to allow for face to face communication but unfortunately reported that he did not have the appropriate resources/technology to support such a visit and instead preferred to proceed with telephone consult. The patient has given verbal consent for this type of encounter. The time spent during this  encounter was 75 minutes. The attendants for this meeting include Tyler Pita MD, Ashlyn Bruning PA-C, Brenas, and patient, Darnelle  Alesia Richards. During the encounter, Tyler Pita MD, Ashlyn Bruning PA-C, and scribe, Wilburn Mylar were located at [place].  Patient, Oscar Valencia was located at home.    Nicholos Johns, PA-C    Tyler Pita, MD  [facility] Direct Dial: [phone]  Fax: [phone] [email or web address]  Skype  LinkedIn  This document serves as a record of services personally performed by Tyler Pita, MD and Freeman Caldron, PA-C. It was created on their behalf by Wilburn Mylar, a trained medical scribe. The creation of this record is based on the scribe's personal observations and the provider's statements to them. This document has been checked and approved by the attending provider.

## 2019-06-15 ENCOUNTER — Encounter: Payer: Self-pay | Admitting: Gastroenterology

## 2019-06-17 ENCOUNTER — Other Ambulatory Visit: Payer: Self-pay

## 2019-06-17 ENCOUNTER — Ambulatory Visit (AMBULATORY_SURGERY_CENTER): Payer: Medicare Other | Admitting: Gastroenterology

## 2019-06-17 ENCOUNTER — Encounter: Payer: Self-pay | Admitting: Gastroenterology

## 2019-06-17 VITALS — BP 110/69 | HR 54 | Temp 95.9°F | Resp 10 | Ht 69.0 in | Wt 169.0 lb

## 2019-06-17 DIAGNOSIS — Z1211 Encounter for screening for malignant neoplasm of colon: Secondary | ICD-10-CM | POA: Diagnosis not present

## 2019-06-17 DIAGNOSIS — Z86010 Personal history of colonic polyps: Secondary | ICD-10-CM | POA: Diagnosis not present

## 2019-06-17 DIAGNOSIS — E7800 Pure hypercholesterolemia, unspecified: Secondary | ICD-10-CM | POA: Diagnosis not present

## 2019-06-17 MED ORDER — SODIUM CHLORIDE 0.9 % IV SOLN: 500.0000 mL | Freq: Once | INTRAVENOUS | Status: DC

## 2019-06-17 NOTE — Op Note (Signed)
[facility] Patient Name: Oscar Valencia Procedure Date: 06/17/2019 2:28 PM MRN: [id number] Endoscopist: Ladene Artist , MD Age: 69 Referring MD:  Date of Birth: [birth year] Gender: Male Account #: [id number] Procedure:                Colonoscopy Indications:              Screening for colorectal malignant neoplasm Medicines:                Monitored Anesthesia Care Procedure:                Pre-Anesthesia Assessment:                           - Prior to the procedure, a History and Physical                            was performed, and patient medications and                            allergies were reviewed. The patient's tolerance of                            previous anesthesia was also reviewed. The risks                            and benefits of the procedure and the sedation                            options and risks were discussed with the patient.                            All questions were answered, and informed consent                            was obtained. Prior Anticoagulants: The patient has                            taken no previous anticoagulant or antiplatelet                            agents. ASA Grade Assessment: II - A patient with                            mild systemic disease. After reviewing the risks                            and benefits, the patient was deemed in                            satisfactory condition to undergo the procedure.                           After obtaining informed consent, the colonoscope  was passed under direct vision. Throughout the                            procedure, the patient's blood pressure, pulse, and                            oxygen saturations were monitored continuously. The                            Colonoscope was introduced through the anus and                            advanced to the the cecum, identified by                            appendiceal orifice and ileocecal  valve. The                            ileocecal valve, appendiceal orifice, and rectum                            were photographed. The quality of the bowel                            preparation was good. The colonoscopy was performed                            without difficulty. The patient tolerated the                            procedure well. Scope In: 2:33:09 PM Scope Out: [id number] PM Scope Withdrawal Time: 0 hours 10 minutes 57 seconds  Total Procedure Duration: 0 hours 13 minutes 48 seconds  Findings:                 The perianal and digital rectal examinations were                            normal.                           Multiple medium-mouthed diverticula were found in                            the entire colon. There was narrowing of the colon                            in association with the diverticular opening. There                            was evidence of diverticular spasm. There was no                            evidence of diverticular bleeding.  Internal hemorrhoids were found during                            retroflexion. The hemorrhoids were medium-sized and                            Grade I (internal hemorrhoids that do not prolapse).                           The exam was otherwise without abnormality on                            direct and retroflexion views. Complications:            No immediate complications. Estimated blood loss:                            None. Estimated Blood Loss:     Estimated blood loss: none. Impression:               - Moderate diverticulosis in the entire examined                            colon.                           - Internal hemorrhoids.                           - The examination was otherwise normal on direct                            and retroflexion views.                           - No specimens collected. Recommendation:           - Consider repeat colonoscopy in 10 years for                             screening purposes.                           - Patient has a contact number available for                            emergencies. The signs and symptoms of potential                            delayed complications were discussed with the                            patient. Return to normal activities tomorrow.                            Written discharge instructions were provided to the  patient.                           - High fiber diet.                           - Continue present medications. Ladene Artist, MD 06/17/2019 2:51:36 PM This report has been signed electronically.

## 2019-06-17 NOTE — Progress Notes (Signed)
Report given to PACU, vss 

## 2019-06-17 NOTE — Patient Instructions (Signed)
Discharge instructions given. Handouts on diverticulosis and hemorrhoids. Resume previous medications. YOU HAD AN ENDOSCOPIC PROCEDURE TODAY AT THE [facility]:   Refer to the procedure report that was given to you for any specific questions about what was found during the examination.  If the procedure report does not answer your questions, please call your gastroenterologist to clarify.  If you requested that your care partner not be given the details of your procedure findings, then the procedure report has been included in a sealed envelope for you to review at your convenience later.  YOU SHOULD EXPECT: Some feelings of bloating in the abdomen. Passage of more gas than usual.  Walking can help get rid of the air that was put into your GI tract during the procedure and reduce the bloating. If you had a lower endoscopy (such as a colonoscopy or flexible sigmoidoscopy) you may notice spotting of blood in your stool or on the toilet paper. If you underwent a bowel prep for your procedure, you may not have a normal bowel movement for a few days.  Please Note:  You might notice some irritation and congestion in your nose or some drainage.  This is from the oxygen used during your procedure.  There is no need for concern and it should clear up in a day or so.  SYMPTOMS TO REPORT IMMEDIATELY:   Following lower endoscopy (colonoscopy or flexible sigmoidoscopy):  Excessive amounts of blood in the stool  Significant tenderness or worsening of abdominal pains  Swelling of the abdomen that is new, acute  Fever of 100F or higher   For urgent or emergent issues, a gastroenterologist can be reached at any hour by calling [phone]. Do not use MyChart messaging for urgent concerns.    DIET:  We do recommend a small meal at first, but then you may proceed to your regular diet.  Drink plenty of fluids but you should avoid alcoholic beverages for 24 hours.  ACTIVITY:  You should plan to  take it easy for the rest of today and you should NOT DRIVE or use heavy machinery until tomorrow (because of the sedation medicines used during the test).    FOLLOW UP: Our staff will call the number listed on your records 48-72 hours following your procedure to check on you and address any questions or concerns that you may have regarding the information given to you following your procedure. If we do not reach you, we will leave a message.  We will attempt to reach you two times.  During this call, we will ask if you have developed any symptoms of COVID 19. If you develop any symptoms (ie: fever, flu-like symptoms, shortness of breath, cough etc.) before then, please call [phone].  If you test positive for Covid 19 in the 2 weeks post procedure, please call and report this information to us.    If any biopsies were taken you will be contacted by phone or by letter within the next 1-3 weeks.  Please call us at [phone] if you have not heard about the biopsies in 3 weeks.    SIGNATURES/CONFIDENTIALITY: You and/or your care partner have signed paperwork which will be entered into your electronic medical record.  These signatures attest to the fact that that the information above on your After Visit Summary has been reviewed and is understood.  Full responsibility of the confidentiality of this discharge information lies with you and/or your care-partner. 

## 2019-06-17 NOTE — Progress Notes (Signed)
Temp check by: [facility] Vital check by:DT  The patient states no changes in medical or surgical history since pre-visit screening on 06/01/19.

## 2019-06-19 ENCOUNTER — Telehealth: Payer: Self-pay

## 2019-06-19 NOTE — Telephone Encounter (Signed)
  Follow up Call-  Call back number 06/17/2019  Post procedure Call Back phone  # [phone]  Permission to leave phone message Yes  Some recent data might be hidden     Patient questions:  Do you have a fever, pain , or abdominal swelling? No. Pain Score  0 *  Have you tolerated food without any problems? Yes.    Have you been able to return to your normal activities? Yes.    Do you have any questions about your discharge instructions: Diet   No. Medications  No. Follow up visit  No.  Do you have questions or concerns about your Care? No.  Actions: * If pain score is 4 or above: No action needed, pain <4. 1. Have you developed a fever since your procedure? no  2.   Have you had an respiratory symptoms (SOB or cough) since your procedure? no  3.   Have you tested positive for COVID 19 since your procedure no  4.   Have you had any family members/close contacts diagnosed with the COVID 19 since your procedure?  no   If yes to any of these questions please route to Joylene John, RN and Erenest Rasher, RN

## 2019-06-19 NOTE — Telephone Encounter (Signed)
NO ANSWER, MESSAGE LEFT FOR PATIENT. 

## 2019-06-22 DIAGNOSIS — N410 Acute prostatitis: Secondary | ICD-10-CM | POA: Diagnosis not present

## 2019-06-22 DIAGNOSIS — N3000 Acute cystitis without hematuria: Secondary | ICD-10-CM | POA: Diagnosis not present

## 2019-06-30 ENCOUNTER — Other Ambulatory Visit: Payer: Self-pay | Admitting: Urology

## 2019-06-30 ENCOUNTER — Encounter: Payer: Self-pay | Admitting: Urology

## 2019-06-30 DIAGNOSIS — C61 Malignant neoplasm of prostate: Secondary | ICD-10-CM | POA: Diagnosis not present

## 2019-06-30 NOTE — Progress Notes (Signed)
Patient has elected to proceed with surgery for treatment of his prostate cancer. -Oscar Valencia

## 2019-07-03 ENCOUNTER — Other Ambulatory Visit: Payer: Self-pay | Admitting: Urology

## 2019-07-13 DIAGNOSIS — H5213 Myopia, bilateral: Secondary | ICD-10-CM | POA: Diagnosis not present

## 2019-07-14 DIAGNOSIS — C61 Malignant neoplasm of prostate: Secondary | ICD-10-CM | POA: Diagnosis not present

## 2019-07-14 DIAGNOSIS — M62838 Other muscle spasm: Secondary | ICD-10-CM | POA: Diagnosis not present

## 2019-07-14 DIAGNOSIS — M6281 Muscle weakness (generalized): Secondary | ICD-10-CM | POA: Diagnosis not present

## 2019-07-14 DIAGNOSIS — M6289 Other specified disorders of muscle: Secondary | ICD-10-CM | POA: Diagnosis not present

## 2019-07-21 NOTE — Patient Instructions (Addendum)
DUE TO COVID-19 ONLY ONE VISITOR IS ALLOWED TO COME WITH YOU AND STAY IN THE WAITING ROOM ONLY DURING PRE OP AND PROCEDURE DAY OF SURGERY. THE 2 VISITORS MAY VISIT WITH YOU AFTER SURGERY IN YOUR PRIVATE ROOM DURING VISITING HOURS ONLY!  YOU NEED TO HAVE A COVID 19 TEST ON__6/10_____ @__11 :30_____, THIS TEST MUST BE DONE BEFORE SURGERY, COME  [address], [place] Oscar Valencia , [id number].  ([place]) ONCE YOUR COVID TEST IS COMPLETED, PLEASE BEGIN THE QUARANTINE INSTRUCTIONS AS OUTLINED IN YOUR HANDOUT.                Oscar Valencia    Your procedure is scheduled on: 08/03/19   Report to [facility] Main  Entrance   Report to Short Stay at 5:30 AM     Call this number if you have problems the morning of surgery [phone]    Remember: Do not eat food or drink liquids :After Midnight.   BRUSH YOUR TEETH MORNING OF SURGERY AND RINSE YOUR MOUTH OUT, NO CHEWING GUM CANDY OR MINTS.     Take these medicines the morning of surgery with A SIP OF WATER: none                                 You may not have any metal on your body including             piercings  Do not wear jewelry,  lotions, powders or deodorant                       Men may shave face and neck.   Do not bring valuables to the hospital. [place].  Contacts, dentures or bridgework may not be worn into surgery.                 Please read over the following fact sheets you were given: _____________________________________________________________________             [facility] - Preparing for Surgery  Before surgery, you can play an important role.   Because skin is not sterile, your skin needs to be as free of germs as possible .  You can reduce the number of germs on your skin by washing with CHG (chlorahexidine gluconate) soap before surgery.   CHG is an antiseptic cleaner which kills germs and bonds with the skin to continue killing germs  even after washing. Please DO NOT use if you have an allergy to CHG or antibacterial soaps.   If your skin becomes reddened/irritated stop using the CHG and inform your nurse when you arrive at Short Stay.  You may shave your face/neck.  Please follow these instructions carefully:  1.  Shower with CHG Soap the night before surgery and the  morning of Surgery.  2.  If you choose to wash your hair, wash your hair first as usual with your  normal  shampoo.  3.  After you shampoo, rinse your hair and body thoroughly to remove the  shampoo.                                        4.  Use CHG as  you would any other liquid soap.  You can apply chg directly  to the skin and wash                       Gently with a scrungie or clean washcloth.  5.  Apply the CHG Soap to your body ONLY FROM THE NECK DOWN.   Do not use on face/ open                           Wound or open sores. Avoid contact with eyes, ears mouth and genitals (private parts).                       Wash face,  Genitals (private parts) with your normal soap.             6.  Wash thoroughly, paying special attention to the area where your surgery  will be performed.  7.  Thoroughly rinse your body with warm water from the neck down.  8.  DO NOT shower/wash with your normal soap after using and rinsing off  the CHG Soap.             9.  Pat yourself dry with a clean towel.            10.  Wear clean pajamas.            11.  Place clean sheets on your bed the night of your first shower and do not  sleep with pets. Day of Surgery : Do not apply any lotions/deodorants the morning of surgery.  Please wear clean clothes to the hospital/surgery center.  FAILURE TO FOLLOW THESE INSTRUCTIONS MAY RESULT IN THE CANCELLATION OF YOUR SURGERY PATIENT SIGNATURE_________________________________  NURSE SIGNATURE__________________________________  ________________________________________________________________________   [name]  An  incentive spirometer is a tool that can help keep your lungs clear and active. This tool measures how well you are filling your lungs with each breath. Taking long deep breaths may help reverse or decrease the chance of developing breathing (pulmonary) problems (especially infection) following:  A long period of time when you are unable to move or be active. BEFORE THE PROCEDURE   If the spirometer includes an indicator to show your best effort, your nurse or respiratory therapist will set it to a desired goal.  If possible, sit up straight or lean slightly forward. Try not to slouch.  Hold the incentive spirometer in an upright position. INSTRUCTIONS FOR USE  1. Sit on the edge of your bed if possible, or sit up as far as you can in bed or on a chair. 2. Hold the incentive spirometer in an upright position. 3. Breathe out normally. 4. Place the mouthpiece in your mouth and seal your lips tightly around it. 5. Breathe in slowly and as deeply as possible, raising the piston or the ball toward the top of the column. 6. Hold your breath for 3-5 seconds or for as long as possible. Allow the piston or ball to fall to the bottom of the column. 7. Remove the mouthpiece from your mouth and breathe out normally. 8. Rest for a few seconds and repeat Steps 1 through 7 at least 10 times every 1-2 hours when you are awake. Take your time and take a few normal breaths between deep breaths. 9. The spirometer may include an indicator to show your best effort. Use the indicator as a  goal to work toward during each repetition. 10. After each set of 10 deep breaths, practice coughing to be sure your lungs are clear. If you have an incision (the cut made at the time of surgery), support your incision when coughing by placing a pillow or rolled up towels firmly against it. Once you are able to get out of bed, walk around indoors and cough well. You may stop using the incentive spirometer when instructed by your  caregiver.  RISKS AND COMPLICATIONS  Take your time so you do not get dizzy or light-headed.  If you are in pain, you may need to take or ask for pain medication before doing incentive spirometry. It is harder to take a deep breath if you are having pain. AFTER USE  Rest and breathe slowly and easily.  It can be helpful to keep track of a log of your progress. Your caregiver can provide you with a simple table to help with this. If you are using the spirometer at home, follow these instructions: [place] IF:   You are having difficultly using the spirometer.  You have trouble using the spirometer as often as instructed.  Your pain medication is not giving enough relief while using the spirometer.  You develop fever of 100.5 F (38.1 C) or higher. SEEK IMMEDIATE MEDICAL CARE IF:   You cough up bloody sputum that had not been present before.  You develop fever of 102 F (38.9 C) or greater.  You develop worsening pain at or near the incision site. MAKE SURE YOU:   Understand these instructions.  Will watch your condition.  Will get help right away if you are not doing well or get worse. Document Released: 06/18/2006 Document Revised: 04/30/2011 Document Reviewed: 08/19/2006 [facility] Patient Information 2014 [place], [place].   ________________________________________________________________________

## 2019-07-22 ENCOUNTER — Encounter (HOSPITAL_COMMUNITY): Payer: Self-pay

## 2019-07-22 ENCOUNTER — Other Ambulatory Visit: Payer: Self-pay

## 2019-07-22 ENCOUNTER — Encounter (HOSPITAL_COMMUNITY)
Admission: RE | Admit: 2019-07-22 | Discharge: 2019-07-22 | Disposition: A | Discharge status: Home / Self Care | Payer: Medicare Other | Source: Ambulatory Visit | Attending: Urology | Admitting: Urology

## 2019-07-22 DIAGNOSIS — Z01818 Encounter for other preprocedural examination: Secondary | ICD-10-CM | POA: Diagnosis not present

## 2019-07-22 HISTORY — DX: Gastro-esophageal reflux disease without esophagitis: K21.9

## 2019-07-22 NOTE — Progress Notes (Signed)
COVID Vaccine Completed:yes Date COVID Vaccine completed:03/30/19 COVID vaccine manufacturer: [employer]     PCP - Dr. Marton Redwood Cardiologist - Dr. Lizbeth Bark  Chest x-ray - no EKG - 2011 Stress Test - no ECHO - 2011 Cardiac Cath - no  Sleep Study - no CPAP -   Fasting Blood Sugar - NA Checks Blood Sugar _____ times a day  Blood Thinner Instructions:NA Aspirin Instructions: Last Dose:  Anesthesia review:   Patient denies shortness of breath, fever, cough and chest pain at PAT appointment yes  Patient verbalized understanding of instructions that were given to them at the PAT appointment. Patient was also instructed that they will need to review over the PAT instructions again at home before surgery. Yes  Pt reports that he had a cardiac work up in 2011 that showed acid reflux and his heart was WNL.

## 2019-07-27 ENCOUNTER — Other Ambulatory Visit: Payer: Self-pay

## 2019-07-27 ENCOUNTER — Encounter (HOSPITAL_COMMUNITY)
Admission: RE | Admit: 2019-07-27 | Discharge: 2019-07-27 | Disposition: A | Discharge status: Home / Self Care | Payer: Medicare Other | Source: Ambulatory Visit | Attending: Urology | Admitting: Urology

## 2019-07-27 DIAGNOSIS — Z01812 Encounter for preprocedural laboratory examination: Secondary | ICD-10-CM | POA: Insufficient documentation

## 2019-07-27 LAB — CBC
HCT: 45.9 % (ref 39.0–52.0)
Hemoglobin: 14.7 g/dL (ref 13.0–17.0)
MCH: 30.6 pg (ref 26.0–34.0)
MCHC: 32 g/dL (ref 30.0–36.0)
MCV: 95.6 fL (ref 80.0–100.0)
Platelets: 237 10*3/uL (ref 150–400)
RBC: 4.8 MIL/uL (ref 4.22–5.81)
RDW: 13.2 % (ref 11.5–15.5)
WBC: 7.2 10*3/uL (ref 4.0–10.5)
nRBC: 0 % (ref 0.0–0.2)

## 2019-07-27 LAB — BASIC METABOLIC PANEL
Anion gap: 10 (ref 5–15)
BUN: 20 mg/dL (ref 8–23)
CO2: 25 mmol/L (ref 22–32)
Calcium: 9.5 mg/dL (ref 8.9–10.3)
Chloride: 105 mmol/L (ref 98–111)
Creatinine, Ser: 0.75 mg/dL (ref 0.61–1.24)
GFR calc Af Amer: >60 mL/min (ref >60)
GFR calc non Af Amer: >60 mL/min (ref >60)
Glucose, Bld: 75 mg/dL (ref 70–99)
Potassium: 3.5 mmol/L (ref 3.5–5.1)
Sodium: 140 mmol/L (ref 135–145)

## 2019-07-28 LAB — ABO/RH: ABO/RH(D): A POS

## 2019-07-30 ENCOUNTER — Ambulatory Visit
Admission: RE | Admit: 2019-07-30 | Discharge: 2019-07-30 | Disposition: A | Discharge status: Home / Self Care | Payer: Medicare Other | Source: Ambulatory Visit | Attending: Urology | Admitting: Urology

## 2019-07-30 ENCOUNTER — Other Ambulatory Visit (HOSPITAL_COMMUNITY)
Admission: RE | Admit: 2019-07-30 | Discharge: 2019-07-30 | Disposition: A | Discharge status: Home / Self Care | Payer: Medicare Other | Source: Ambulatory Visit | Attending: Urology | Admitting: Urology

## 2019-07-30 DIAGNOSIS — Z20822 Contact with and (suspected) exposure to covid-19: Secondary | ICD-10-CM | POA: Insufficient documentation

## 2019-07-30 DIAGNOSIS — M62838 Other muscle spasm: Secondary | ICD-10-CM | POA: Diagnosis not present

## 2019-07-30 DIAGNOSIS — N393 Stress incontinence (female) (male): Secondary | ICD-10-CM | POA: Diagnosis not present

## 2019-07-30 DIAGNOSIS — R972 Elevated prostate specific antigen [PSA]: Secondary | ICD-10-CM | POA: Diagnosis not present

## 2019-07-30 DIAGNOSIS — C61 Malignant neoplasm of prostate: Secondary | ICD-10-CM

## 2019-07-30 DIAGNOSIS — Z01812 Encounter for preprocedural laboratory examination: Secondary | ICD-10-CM | POA: Diagnosis not present

## 2019-07-30 DIAGNOSIS — M6281 Muscle weakness (generalized): Secondary | ICD-10-CM | POA: Diagnosis not present

## 2019-07-30 LAB — SARS CORONAVIRUS 2 (TAT 6-24 HRS): SARS Coronavirus 2: NEGATIVE

## 2019-07-30 MED ORDER — GADOBENATE DIMEGLUMINE 529 MG/ML IV SOLN
15.0000 mL | Freq: Once | INTRAVENOUS | Status: AC | PRN
  Administered 2019-07-30: 15 mL via INTRAVENOUS

## 2019-07-31 NOTE — H&P (Signed)
CC: Prostate Cancer    Oscar Valencia is a 69 year old gentleman who was found to have an elevated PSA of 5.24 and right lateral induration of the prostate. He underwent a TRUS biopsy of the prostate on 04/30/19 and this confirmed Gleason 4+3=7 adenocarcinoma of the prostate with 5 out of 12 biopsy cores positive for malignancy.   He developed prostatitis after his biopsy requiring antibiotic therapy. He was initially treated with 3rd generation cephalosporin therapy but developed recurrent symptoms with another positive fluoroquinolone E coli. He was treated with doxycycline subsequently. His voiding symptoms have now resolved. He is completely asymptomatic. He presents today to repeat a urinalysis.   He returns today to discuss options for treatment of his prostate cancer   Family history: Father (alive at 20)  ` Imaging studies: None.   PMH: He has a history of hyperlipidemia and arthritis.  PSH: No abdominal surgery.   TNM stage: cT2b Nx Mx  PSA: 5.24  Gleason score: 4+3=7 (Grade group 3)  Biopsy (04/30/19): 5/12 cores positive  Left: L apex (20%, 3+4=7)  Right: R lateral apex (50%, 3+4=7), R mid (5%, 3+3=6), R lateral mid (10%, 3+3=6), R lateral base (40%, 4+3=7, PNI)  Prostate volume: 29.2 cc   Nomogram  OC disease: 30%  EPE: 68%  SVI: 13%  LNI: 15%  PFS (5 year, 10 year): 52%, 37%   Urinary function: IPSS is 1.  Erectile function: SHIM score is 22.   Interval history:     ALLERGIES: Penicillin    MEDICATIONS: Atorvastatin Calcium     GU PSH: Prostate Needle Biopsy - 04/30/2019     NON-GU PSH: Hand/finger Surgery, Left Knee Arthroscopy, Left Surgical Pathology, Gross And Microscopic Examination For Prostate Needle - 04/30/2019     GU PMH: Acute prostatitis - 05/06/2019 Prostate Cancer - 05/06/2019 Elevated PSA - 04/17/2019 Prostate nodule w/o LUTS - 04/17/2019    NON-GU PMH: Arthritis Hypercholesterolemia    FAMILY HISTORY: Prostate Cancer - Father skin  cancer - Mother    Notes: 2 daughters, 1 son    SOCIAL HISTORY: Marital Status: Married Preferred Language: English; Ethnicity: Not Hispanic Or Latino; Race: White Current Smoking Status: Patient does not smoke anymore. Has not smoked since 03/22/1989. Smoked for 10 years. Smoked less than 1/2 pack per day.   Tobacco Use Assessment Completed: Used Tobacco in last 30 days? Drinks 1 drink per day. Social Drinker.  Drinks 2 caffeinated drinks per day.    REVIEW OF SYSTEMS:    GU Review Male:   Patient denies frequent urination, hard to postpone urination, burning/ pain with urination, get up at night to urinate, leakage of urine, stream starts and stops, trouble starting your streams, and have to strain to urinate .  Gastrointestinal (Upper):   Patient denies nausea and vomiting.  Gastrointestinal (Lower):   Patient denies diarrhea and constipation.  Constitutional:   Patient denies fever, night sweats, weight loss, and fatigue.  Skin:   Patient denies skin rash/ lesion and itching.  Eyes:   Patient denies blurred vision and double vision.  Ears/ Nose/ Throat:   Patient denies sore throat and sinus problems.  Hematologic/Lymphatic:   Patient denies swollen glands and easy bruising.  Cardiovascular:   Patient denies leg swelling and chest pains.  Respiratory:   Patient denies cough and shortness of breath.  Endocrine:   Patient denies excessive thirst.  Musculoskeletal:   Patient denies back pain and joint pain.  Neurological:   Patient denies headaches and  dizziness.  Psychologic:   Patient denies depression and anxiety.   VITAL SIGNS:     Weight 162.4 lb / 73.66 kg  Height 70 in / 177.8 cm  BMI 23.3 kg/m   MULTI-SYSTEM PHYSICAL EXAMINATION:    Constitutional: Well-nourished. No physical deformities. Normally developed. Good grooming.  Neck: Neck symmetrical, not swollen. Normal tracheal position.  Respiratory: No labored breathing, no use of accessory muscles. Clear bilaterally.   Cardiovascular: Normal temperature, normal extremity pulses, no swelling, no varicosities. Regular rate and rhythm.  Lymphatic: No enlargement of neck, axillae, groin.  Skin: No paleness, no jaundice, no cyanosis. No lesion, no ulcer, no rash.  Neurologic / Psychiatric: Oriented to time, oriented to place, oriented to person. No depression, no anxiety, no agitation.  Gastrointestinal: No mass, no tenderness, no rigidity, non obese abdomen.  Eyes: Normal conjunctivae. Normal eyelids.  Ears, Nose, Mouth, and Throat: Left ear no scars, no lesions, no masses. Right ear no scars, no lesions, no masses. Nose no scars, no lesions, no masses. Normal hearing. Normal lips.  Musculoskeletal: Normal gait and station of head and neck.     Complexity of Data:  Lab Test Review:   PSA  Records Review:   Pathology Reports, Previous Patient Records   PROCEDURES:          Urinalysis w/Scope Micro  WBC/hpf: 0 - 5/hpf  RBC/hpf: 0 - 2/hpf  Bacteria: NS (Not Seen)  Cystals: NS (Not Seen)  Casts: NS (Not Seen)  Trichomonas: Not Present  Mucous: Not Present  Epithelial Cells: NS (Not Seen)  Yeast: NS (Not Seen)  Sperm: Not Present    ASSESSMENT:      ICD-10 Details  1 GU:   Prostate Cancer - C61    PLAN:      1. Unfavorable intermediate risk prostate cancer: I had a detailed discussion with Oscar Valencia and his wife today regarding his options for treatment. He is very well informed and has had further discussion with Dr. Tammi Klippel regarding radiation therapy options. He has made the decision today that he does wish to proceed with surgical therapy. The patient was counseled about the natural history of prostate cancer and the standard treatment options that are available for prostate cancer. It was explained to him how his age and life expectancy, clinical stage, Gleason score, and PSA affect his prognosis, the decision to proceed with additional staging studies, as well as how that information influences  recommended treatment strategies. We discussed the roles for active surveillance, radiation therapy, surgical therapy, androgen deprivation, as well as ablative therapy options for the treatment of prostate cancer as appropriate to his individual cancer situation. We discussed the risks and benefits of these options with regard to their impact on cancer control and also in terms of potential adverse events, complications, and impact on quality of life particularly related to urinary and sexual function. The patient was encouraged to ask questions throughout the discussion today and all questions were answered to his stated satisfaction. In addition, the patient was provided with and/or directed to appropriate resources and literature for further education about prostate cancer and treatment options. We discussed surgical therapy for prostate cancer including the different available surgical approaches. We discussed, in detail, the risks and expectations of surgery with regard to cancer control, urinary control, and erectile function as well as the expected postoperative recovery process. Additional risks of surgery including but not limited to bleeding, infection, hernia formation, nerve damage, lymphocele formation, bowel/rectal injury potentially necessitating colostomy, damage  to the urinary tract resulting in urine leakage, urethral stricture, and the cardiopulmonary risks such as myocardial infarction, stroke, death, venothromboembolism, etc. were explained. The risk of open surgical conversion for robotic/laparoscopic prostatectomy was also discussed.   Considering the palpable and higher volume/higher grade disease on the right side of the prostate, he will undergo an MRI of the prostate prior to surgery to determine appropriate nerve-sparing. Assuming no local extension, he will undergo a bilateral nerve-sparing robot assisted laparoscopic radical prostatectomy and bilateral pelvic lymphadenectomy. We will  try to give him some time before he has his MRI to allow his prostatitis to have completely resolved along with any associated inflammation.    I reviewed his MRI results with him today. Although there is concern for possible extraprostatic extension posteriorly at the apex, the lateral aspects of the prostate with to be quite clean and not concerning for extraprostatic disease. As such, I told that I do think he can proceed with a bilateral nerve-sparing procedure. We also discussed the potential for artifact obscuring the posterior apex. I do not think his MRI needs to be repeated is this is strictly for surgical planning and further imaging is not likely to offer additional information that will alter that plan at this point.     * Signed by Raynelle Bring, M.D. on 07/31/19 at 11:07 AM (EDT)*

## 2019-08-03 ENCOUNTER — Observation Stay (HOSPITAL_COMMUNITY)
Admission: RE | Admit: 2019-08-03 | Discharge: 2019-08-04 | Disposition: A | Discharge status: Home / Self Care | Payer: Medicare Other | Attending: Urology | Admitting: Urology

## 2019-08-03 ENCOUNTER — Other Ambulatory Visit: Payer: Self-pay

## 2019-08-03 ENCOUNTER — Ambulatory Visit (HOSPITAL_COMMUNITY): Payer: Medicare Other

## 2019-08-03 ENCOUNTER — Encounter (HOSPITAL_COMMUNITY): Payer: Self-pay | Admitting: Urology

## 2019-08-03 ENCOUNTER — Encounter (HOSPITAL_COMMUNITY)
Admission: RE | Disposition: A | Discharge status: Home / Self Care | Payer: Self-pay | Source: Home / Self Care | Attending: Urology

## 2019-08-03 DIAGNOSIS — Z87891 Personal history of nicotine dependence: Secondary | ICD-10-CM | POA: Insufficient documentation

## 2019-08-03 DIAGNOSIS — M1990 Unspecified osteoarthritis, unspecified site: Secondary | ICD-10-CM | POA: Diagnosis not present

## 2019-08-03 DIAGNOSIS — C61 Malignant neoplasm of prostate: Principal | ICD-10-CM | POA: Diagnosis present

## 2019-08-03 DIAGNOSIS — E785 Hyperlipidemia, unspecified: Secondary | ICD-10-CM | POA: Diagnosis not present

## 2019-08-03 DIAGNOSIS — K219 Gastro-esophageal reflux disease without esophagitis: Secondary | ICD-10-CM | POA: Diagnosis not present

## 2019-08-03 HISTORY — PX: ROBOT ASSISTED LAPAROSCOPIC RADICAL PROSTATECTOMY: SHX5141

## 2019-08-03 HISTORY — PX: LYMPHADENECTOMY: SHX5960

## 2019-08-03 LAB — TYPE AND SCREEN
ABO/RH(D): A POS
Antibody Screen: NEGATIVE

## 2019-08-03 LAB — HEMOGLOBIN AND HEMATOCRIT, BLOOD
HCT: 43.3 % (ref 39.0–52.0)
Hemoglobin: 14.3 g/dL (ref 13.0–17.0)

## 2019-08-03 SURGERY — XI ROBOTIC ASSISTED LAPAROSCOPIC RADICAL PROSTATECTOMY LEVEL 2
Anesthesia: General

## 2019-08-03 MED ORDER — CEFAZOLIN SODIUM-DEXTROSE 1-4 GM/50ML-% IV SOLN
1.0000 g | Freq: Three times a day (TID) | INTRAVENOUS | Status: AC
  Administered 2019-08-03 – 2019-08-04 (×2): 1 g via INTRAVENOUS
  Filled 2019-08-03 (×2): qty 50

## 2019-08-03 MED ORDER — BUPIVACAINE HCL 0.25 % IJ SOLN
INTRAMUSCULAR | Status: DC | PRN
  Administered 2019-08-03: 30 mL

## 2019-08-03 MED ORDER — DEXAMETHASONE SODIUM PHOSPHATE 10 MG/ML IJ SOLN
INTRAMUSCULAR | Status: AC
  Filled 2019-08-03: qty 1

## 2019-08-03 MED ORDER — EPHEDRINE SULFATE-NACL 50-0.9 MG/10ML-% IV SOSY
PREFILLED_SYRINGE | INTRAVENOUS | Status: DC | PRN
  Administered 2019-08-03: 10 mg via INTRAVENOUS

## 2019-08-03 MED ORDER — SULFAMETHOXAZOLE-TRIMETHOPRIM 800-160 MG PO TABS
1.0000 | ORAL_TABLET | Freq: Two times a day (BID) | ORAL | 0 refills | Status: DC
Start: 2019-08-03 — End: 2023-05-23

## 2019-08-03 MED ORDER — SODIUM CHLORIDE 0.9 % IR SOLN
Status: DC | PRN
  Administered 2019-08-03: 1000 mL

## 2019-08-03 MED ORDER — ONDANSETRON HCL 4 MG/2ML IJ SOLN
INTRAMUSCULAR | Status: AC
  Administered 2019-08-03: 4 mg via INTRAVENOUS
  Filled 2019-08-03: qty 2

## 2019-08-03 MED ORDER — LACTATED RINGERS IV SOLN
INTRAVENOUS | Status: DC
  Administered 2019-08-03: 1000 mL via INTRAVENOUS

## 2019-08-03 MED ORDER — HYDROMORPHONE HCL 1 MG/ML IJ SOLN
0.2500 mg | INTRAMUSCULAR | Status: DC | PRN
  Administered 2019-08-03 (×3): 0.5 mg via INTRAVENOUS

## 2019-08-03 MED ORDER — BELLADONNA ALKALOIDS-OPIUM 16.2-60 MG RE SUPP
RECTAL | Status: AC
  Administered 2019-08-03: 1 via RECTAL
  Filled 2019-08-03: qty 1

## 2019-08-03 MED ORDER — ONDANSETRON HCL 4 MG/2ML IJ SOLN
INTRAMUSCULAR | Status: AC
  Filled 2019-08-03: qty 2

## 2019-08-03 MED ORDER — ROCURONIUM BROMIDE 10 MG/ML (PF) SYRINGE
PREFILLED_SYRINGE | INTRAVENOUS | Status: DC | PRN
  Administered 2019-08-03: 20 mg via INTRAVENOUS
  Administered 2019-08-03 (×2): 10 mg via INTRAVENOUS
  Administered 2019-08-03: 20 mg via INTRAVENOUS
  Administered 2019-08-03: 80 mg via INTRAVENOUS

## 2019-08-03 MED ORDER — DOCUSATE SODIUM 100 MG PO CAPS
100.0000 mg | ORAL_CAPSULE | Freq: Two times a day (BID) | ORAL | Status: DC
  Administered 2019-08-03 – 2019-08-04 (×2): 100 mg via ORAL
  Filled 2019-08-03 (×2): qty 1

## 2019-08-03 MED ORDER — DEXAMETHASONE SODIUM PHOSPHATE 10 MG/ML IJ SOLN
INTRAMUSCULAR | Status: DC | PRN
  Administered 2019-08-03: 8 mg via INTRAVENOUS

## 2019-08-03 MED ORDER — DIPHENHYDRAMINE HCL 12.5 MG/5ML PO ELIX: 12.5000 mg | ORAL_SOLUTION | Freq: Four times a day (QID) | ORAL | Status: DC | PRN

## 2019-08-03 MED ORDER — FENTANYL CITRATE (PF) 100 MCG/2ML IJ SOLN
INTRAMUSCULAR | Status: AC
  Filled 2019-08-03: qty 2

## 2019-08-03 MED ORDER — ORAL CARE MOUTH RINSE: 15.0000 mL | Freq: Once | OROMUCOSAL | Status: AC

## 2019-08-03 MED ORDER — SUGAMMADEX SODIUM 200 MG/2ML IV SOLN
INTRAVENOUS | Status: DC | PRN
  Administered 2019-08-03: 160 mg via INTRAVENOUS

## 2019-08-03 MED ORDER — EPHEDRINE 5 MG/ML INJ
INTRAVENOUS | Status: AC
  Filled 2019-08-03: qty 10

## 2019-08-03 MED ORDER — TRAMADOL HCL 50 MG PO TABS: 50.0000 mg | ORAL_TABLET | Freq: Four times a day (QID) | ORAL | 0 refills | Status: DC | PRN

## 2019-08-03 MED ORDER — ROCURONIUM BROMIDE 10 MG/ML (PF) SYRINGE
PREFILLED_SYRINGE | INTRAVENOUS | Status: AC
  Filled 2019-08-03: qty 10

## 2019-08-03 MED ORDER — PROPOFOL 10 MG/ML IV BOLUS
INTRAVENOUS | Status: AC
  Filled 2019-08-03: qty 20

## 2019-08-03 MED ORDER — HEPARIN SODIUM (PORCINE) 1000 UNIT/ML IJ SOLN
INTRAMUSCULAR | Status: AC
  Filled 2019-08-03: qty 1

## 2019-08-03 MED ORDER — BACITRACIN-NEOMYCIN-POLYMYXIN 400-5-5000 EX OINT: 1.0000 "application " | TOPICAL_OINTMENT | Freq: Three times a day (TID) | CUTANEOUS | Status: DC | PRN

## 2019-08-03 MED ORDER — DIPHENHYDRAMINE HCL 50 MG/ML IJ SOLN: 12.5000 mg | Freq: Four times a day (QID) | INTRAMUSCULAR | Status: DC | PRN

## 2019-08-03 MED ORDER — CEFAZOLIN SODIUM-DEXTROSE 2-4 GM/100ML-% IV SOLN
2.0000 g | Freq: Once | INTRAVENOUS | Status: AC
  Administered 2019-08-03: 2 g via INTRAVENOUS
  Filled 2019-08-03: qty 100

## 2019-08-03 MED ORDER — BUPIVACAINE HCL 0.25 % IJ SOLN
INTRAMUSCULAR | Status: AC
  Filled 2019-08-03: qty 1

## 2019-08-03 MED ORDER — KCL IN DEXTROSE-NACL 20-5-0.45 MEQ/L-%-% IV SOLN
INTRAVENOUS | Status: DC
  Filled 2019-08-03 (×3): qty 1000

## 2019-08-03 MED ORDER — HYDROMORPHONE HCL 1 MG/ML IJ SOLN
INTRAMUSCULAR | Status: AC
  Filled 2019-08-03: qty 1

## 2019-08-03 MED ORDER — CHLORHEXIDINE GLUCONATE 0.12 % MT SOLN
15.0000 mL | Freq: Once | OROMUCOSAL | Status: AC
  Administered 2019-08-03: 15 mL via OROMUCOSAL

## 2019-08-03 MED ORDER — FLEET ENEMA 7-19 GM/118ML RE ENEM
1.0000 | ENEMA | Freq: Once | RECTAL | Status: DC
  Filled 2019-08-03: qty 1

## 2019-08-03 MED ORDER — KETOROLAC TROMETHAMINE 15 MG/ML IJ SOLN
INTRAMUSCULAR | Status: AC
  Administered 2019-08-03: 15 mg via INTRAVENOUS
  Filled 2019-08-03: qty 1

## 2019-08-03 MED ORDER — LIDOCAINE 2% (20 MG/ML) 5 ML SYRINGE
INTRAMUSCULAR | Status: DC | PRN
  Administered 2019-08-03: 60 mg via INTRAVENOUS

## 2019-08-03 MED ORDER — PROPOFOL 10 MG/ML IV BOLUS
INTRAVENOUS | Status: DC | PRN
  Administered 2019-08-03: 50 mg via INTRAVENOUS
  Administered 2019-08-03: 150 mg via INTRAVENOUS

## 2019-08-03 MED ORDER — BELLADONNA ALKALOIDS-OPIUM 16.2-60 MG RE SUPP: 1.0000 | Freq: Four times a day (QID) | RECTAL | Status: DC | PRN

## 2019-08-03 MED ORDER — ONDANSETRON HCL 4 MG/2ML IJ SOLN: 4.0000 mg | Freq: Once | INTRAMUSCULAR | Status: DC | PRN

## 2019-08-03 MED ORDER — HYDROMORPHONE HCL 2 MG/ML IJ SOLN
INTRAMUSCULAR | Status: AC
  Filled 2019-08-03: qty 1

## 2019-08-03 MED ORDER — MIDAZOLAM HCL 2 MG/2ML IJ SOLN
INTRAMUSCULAR | Status: AC
  Filled 2019-08-03: qty 2

## 2019-08-03 MED ORDER — LACTATED RINGERS IV SOLN: INTRAVENOUS | Status: DC | PRN

## 2019-08-03 MED ORDER — CHLORHEXIDINE GLUCONATE CLOTH 2 % EX PADS
6.0000 | MEDICATED_PAD | Freq: Every day | CUTANEOUS | Status: DC
  Administered 2019-08-04: 6 via TOPICAL

## 2019-08-03 MED ORDER — MIDAZOLAM HCL 2 MG/2ML IJ SOLN
INTRAMUSCULAR | Status: DC | PRN
  Administered 2019-08-03: 2 mg via INTRAVENOUS

## 2019-08-03 MED ORDER — ONDANSETRON HCL 4 MG/2ML IJ SOLN
INTRAMUSCULAR | Status: DC | PRN
  Administered 2019-08-03: 4 mg via INTRAVENOUS

## 2019-08-03 MED ORDER — FENTANYL CITRATE (PF) 100 MCG/2ML IJ SOLN
INTRAMUSCULAR | Status: DC | PRN
  Administered 2019-08-03: 50 ug via INTRAVENOUS
  Administered 2019-08-03: 100 ug via INTRAVENOUS
  Administered 2019-08-03 (×3): 50 ug via INTRAVENOUS

## 2019-08-03 MED ORDER — MAGNESIUM CITRATE PO SOLN
1.0000 | Freq: Once | ORAL | Status: DC
  Filled 2019-08-03: qty 296

## 2019-08-03 MED ORDER — ONDANSETRON HCL 4 MG/2ML IJ SOLN: 4.0000 mg | INTRAMUSCULAR | Status: DC | PRN

## 2019-08-03 MED ORDER — KETOROLAC TROMETHAMINE 15 MG/ML IJ SOLN
15.0000 mg | Freq: Four times a day (QID) | INTRAMUSCULAR | Status: DC
  Administered 2019-08-03 – 2019-08-04 (×4): 15 mg via INTRAVENOUS
  Filled 2019-08-03 (×4): qty 1

## 2019-08-03 MED ORDER — MEPERIDINE HCL 50 MG/ML IJ SOLN: 6.2500 mg | INTRAMUSCULAR | Status: DC | PRN

## 2019-08-03 MED ORDER — HYDROMORPHONE HCL 1 MG/ML IJ SOLN
INTRAMUSCULAR | Status: AC
  Administered 2019-08-03: 0.5 mg via INTRAVENOUS
  Filled 2019-08-03: qty 1

## 2019-08-03 MED ORDER — MORPHINE SULFATE (PF) 4 MG/ML IV SOLN
INTRAVENOUS | Status: AC
  Administered 2019-08-03: 4 mg via INTRAVENOUS
  Filled 2019-08-03: qty 1

## 2019-08-03 MED ORDER — ATORVASTATIN CALCIUM 20 MG PO TABS
20.0000 mg | ORAL_TABLET | Freq: Every day | ORAL | Status: DC
  Administered 2019-08-03: 20 mg via ORAL
  Filled 2019-08-03: qty 1

## 2019-08-03 MED ORDER — HYDROMORPHONE HCL 1 MG/ML IJ SOLN
INTRAMUSCULAR | Status: DC | PRN
  Administered 2019-08-03: .4 mg via INTRAVENOUS
  Administered 2019-08-03: .6 mg via INTRAVENOUS

## 2019-08-03 MED ORDER — MORPHINE SULFATE (PF) 4 MG/ML IV SOLN
2.0000 mg | INTRAVENOUS | Status: DC | PRN
  Administered 2019-08-03: 2 mg via INTRAVENOUS
  Filled 2019-08-03: qty 1

## 2019-08-03 MED ORDER — ZOLPIDEM TARTRATE 5 MG PO TABS: 5.0000 mg | ORAL_TABLET | Freq: Every evening | ORAL | Status: DC | PRN

## 2019-08-03 MED ORDER — SODIUM CHLORIDE 0.9 % IV BOLUS
1000.0000 mL | Freq: Once | INTRAVENOUS | Status: AC
  Administered 2019-08-03: 1000 mL via INTRAVENOUS

## 2019-08-03 MED ORDER — LIDOCAINE 2% (20 MG/ML) 5 ML SYRINGE
INTRAMUSCULAR | Status: AC
  Filled 2019-08-03: qty 5

## 2019-08-03 MED ORDER — ACETAMINOPHEN 325 MG PO TABS
650.0000 mg | ORAL_TABLET | ORAL | Status: DC | PRN
  Administered 2019-08-03: 650 mg via ORAL
  Filled 2019-08-03: qty 2

## 2019-08-03 SURGICAL SUPPLY — 60 items
APPLICATOR COTTON TIP 6 STRL (MISCELLANEOUS) ×2 IMPLANT
APPLICATOR COTTON TIP 6IN STRL (MISCELLANEOUS) ×3
CATH FOLEY 2W COUNCIL 5CC 18FR (CATHETERS) ×3 IMPLANT
CATH FOLEY 2WAY SLVR 18FR 30CC (CATHETERS) ×3 IMPLANT
CATH ROBINSON RED A/P 16FR (CATHETERS) ×3 IMPLANT
CATH ROBINSON RED A/P 8FR (CATHETERS) ×3 IMPLANT
CATH TIEMANN FOLEY 18FR 5CC (CATHETERS) ×3 IMPLANT
CHLORAPREP W/TINT 26 (MISCELLANEOUS) ×3 IMPLANT
CLIP VESOLOCK LG 6/CT PURPLE (CLIP) ×6 IMPLANT
COVER SURGICAL LIGHT HANDLE (MISCELLANEOUS) ×3 IMPLANT
COVER TIP SHEARS 8 DVNC (MISCELLANEOUS) ×2 IMPLANT
COVER TIP SHEARS 8MM DA VINCI (MISCELLANEOUS) ×3
COVER WAND RF STERILE (DRAPES) IMPLANT
CUTTER ECHEON FLEX ENDO 45 340 (ENDOMECHANICALS) ×3 IMPLANT
DECANTER SPIKE VIAL GLASS SM (MISCELLANEOUS) ×3 IMPLANT
DERMABOND ADVANCED (GAUZE/BANDAGES/DRESSINGS) ×1
DERMABOND ADVANCED .7 DNX12 (GAUZE/BANDAGES/DRESSINGS) ×2 IMPLANT
DRAIN CHANNEL RND F F (WOUND CARE) ×3 IMPLANT
DRAPE ARM DVNC X/XI (DISPOSABLE) ×8 IMPLANT
DRAPE COLUMN DVNC XI (DISPOSABLE) ×2 IMPLANT
DRAPE DA VINCI XI ARM (DISPOSABLE) ×12
DRAPE DA VINCI XI COLUMN (DISPOSABLE) ×3
DRAPE SURG IRRIG POUCH 19X23 (DRAPES) ×3 IMPLANT
DRSG TEGADERM 4X4.75 (GAUZE/BANDAGES/DRESSINGS) ×3 IMPLANT
ELECT REM PT RETURN 15FT ADLT (MISCELLANEOUS) ×3 IMPLANT
GLOVE BIO SURGEON STRL SZ 6.5 (GLOVE) ×3 IMPLANT
GLOVE BIOGEL M STRL SZ7.5 (GLOVE) ×6 IMPLANT
GOWN STRL REUS W/TWL LRG LVL3 (GOWN DISPOSABLE) ×9 IMPLANT
GUIDEWIRE STR DUAL SENSOR (WIRE) ×3 IMPLANT
HOLDER FOLEY CATH W/STRAP (MISCELLANEOUS) ×3 IMPLANT
IRRIG SUCT STRYKERFLOW 2 WTIP (MISCELLANEOUS) ×3
IRRIGATION SUCT STRKRFLW 2 WTP (MISCELLANEOUS) ×2 IMPLANT
IV LACTATED RINGERS 1000ML (IV SOLUTION) ×3 IMPLANT
KIT TURNOVER KIT A (KITS) IMPLANT
NDL SAFETY ECLIPSE 18X1.5 (NEEDLE) ×2 IMPLANT
NEEDLE HYPO 18GX1.5 SHARP (NEEDLE) ×3
PACK ROBOTIC CUSTOM UROLOGY (CUSTOM PROCEDURE TRAY) ×3 IMPLANT
PENCIL SMOKE EVACUATOR (MISCELLANEOUS) IMPLANT
SEAL CANN UNIV 5-8 DVNC XI (MISCELLANEOUS) ×8 IMPLANT
SEAL XI 5MM-8MM UNIVERSAL (MISCELLANEOUS) ×12
SET IRRIG Y TYPE TUR BLADDER L (SET/KITS/TRAYS/PACK) ×6 IMPLANT
SET TUBE SMOKE EVAC HIGH FLOW (TUBING) ×3 IMPLANT
SOLUTION ELECTROLUBE (MISCELLANEOUS) ×3 IMPLANT
STAPLE RELOAD 45 GRN (STAPLE) ×2 IMPLANT
STAPLE RELOAD 45MM GREEN (STAPLE) ×3
SUT ETHILON 3 0 PS 1 (SUTURE) ×3 IMPLANT
SUT MNCRL 3 0 RB1 (SUTURE) ×2 IMPLANT
SUT MNCRL 3 0 VIOLET RB1 (SUTURE) ×2 IMPLANT
SUT MNCRL AB 4-0 PS2 18 (SUTURE) ×6 IMPLANT
SUT MONOCRYL 3 0 RB1 (SUTURE) ×6
SUT VIC AB 0 CT1 27 (SUTURE) ×3
SUT VIC AB 0 CT1 27XBRD ANTBC (SUTURE) ×2 IMPLANT
SUT VIC AB 0 UR5 27 (SUTURE) ×3 IMPLANT
SUT VIC AB 2-0 SH 27 (SUTURE) ×3
SUT VIC AB 2-0 SH 27X BRD (SUTURE) ×2 IMPLANT
SUT VICRYL 0 UR6 27IN ABS (SUTURE) ×6 IMPLANT
SYR 27GX1/2 1ML LL SAFETY (SYRINGE) ×3 IMPLANT
TOWEL OR NON WOVEN STRL DISP B (DISPOSABLE) ×3 IMPLANT
TROCAR XCEL NON-BLD 5MMX100MML (ENDOMECHANICALS) IMPLANT
WATER STERILE IRR 1000ML POUR (IV SOLUTION) ×3 IMPLANT

## 2019-08-03 NOTE — Progress Notes (Signed)
Urologic Surgery Post-op note  Subjective: The patient is doing well.  No complaints. Had mild nausea after Ginger Ale, now well controlled after avoiding carbonation.   Objective: Vital signs in last 24 hours: Temp:  [97.6 F (36.4 C)-98.7 F (37.1 C)] 97.6 F (36.4 C) (06/14 1300) Pulse Rate:  [63-83] 63 (06/14 1400) Resp:  [8-18] 14 (06/14 1300) BP: (143-172)/(76-110) 148/79 (06/14 1400) SpO2:  [96 %-100 %] 100 % (06/14 1400) Weight:  [76.8 kg] 76.8 kg (06/14 0535)  Intake/Output from previous day: No intake/output data recorded. Intake/Output this shift: Total I/O In: 1800 [I.V.:1700; IV Piggyback:100] Out: 600 [Urine:225; Drains:100; Blood:275]  Physical Exam:  General: Alert and oriented. Abdomen: Soft, Nondistended.  GU: Foley in place with clear yellow urine Incisions: Clean and dry. JP SS  Lab Results: Recent Labs    08/03/19 1058  HGB 14.3  HCT 43.3    Assessment/Plan: POD#0 RALP + BLPLND  Multimodal analgesia mIVF CLD, no carbonation Foley to drainage JP to bulb suction AM labs  OOB and ambulate tonight 1-2x

## 2019-08-03 NOTE — Anesthesia Postprocedure Evaluation (Signed)
Anesthesia Post Note  Patient: Oscar Valencia  Procedure(s) Performed: XI ROBOTIC ASSISTED LAPAROSCOPIC RADICAL PROSTATECTOMY LEVEL 2; FLEXIBLE CYSTOSCOPY (N/A ) LYMPHADENECTOMY, PELVIC (Bilateral )     Patient location during evaluation: PACU Anesthesia Type: General Level of consciousness: awake and alert Pain management: pain level controlled Vital Signs Assessment: post-procedure vital signs reviewed and stable Respiratory status: spontaneous breathing, nonlabored ventilation, respiratory function stable and patient connected to nasal cannula oxygen Cardiovascular status: blood pressure returned to baseline and stable Postop Assessment: no apparent nausea or vomiting Anesthetic complications: no   No complications documented.  Last Vitals:  Vitals:   08/03/19 1330 08/03/19 1400  BP: (!) 143/90 (!) 148/79  Pulse: 64 63  Resp:    Temp:    SpO2: 100% 100%    Last Pain:  Vitals:   08/03/19 1400  TempSrc:   PainSc: [place] DAVID

## 2019-08-03 NOTE — Interval H&P Note (Signed)
History and Physical Interval Note:  08/03/2019 6:59 AM  Oscar Valencia  has presented today for surgery, with the diagnosis of PROSTATE CANCER.  The various methods of treatment have been discussed with the patient and family. After consideration of risks, benefits and other options for treatment, the patient has consented to  Procedure(s): XI ROBOTIC ASSISTED LAPAROSCOPIC RADICAL PROSTATECTOMY LEVEL 2 (N/A) LYMPHADENECTOMY, PELVIC (Bilateral) as a surgical intervention.  The patient's history has been reviewed, patient examined, no change in status, stable for surgery.  I have reviewed the patient's chart and labs.  Questions were answered to the patient's satisfaction.     [employer]

## 2019-08-03 NOTE — Discharge Instructions (Signed)

## 2019-08-03 NOTE — Anesthesia Procedure Notes (Signed)
Procedure Name: Intubation Date/Time: 08/03/2019 7:27 AM Performed by: Niel Hummer, CRNA Pre-anesthesia Checklist: Patient identified, Emergency Drugs available, Suction available and Patient being monitored Patient Re-evaluated:Patient Re-evaluated prior to induction Oxygen Delivery Method: Circle system utilized Preoxygenation: Pre-oxygenation with 100% oxygen Induction Type: IV induction Ventilation: Mask ventilation without difficulty Laryngoscope Size: Mac and 4 Grade View: Grade I Tube type: Oral Tube size: 7.5 mm Number of attempts: 1 Airway Equipment and Method: Stylet Placement Confirmation: ETT inserted through vocal cords under direct vision,  positive ETCO2 and breath sounds checked- equal and bilateral Secured at: 22 cm Tube secured with: Tape Dental Injury: Teeth and Oropharynx as per pre-operative assessment  Comments: DL done by EMT student, successful tube placement.

## 2019-08-03 NOTE — Transfer of Care (Signed)
Immediate Anesthesia Transfer of Care Note  Patient: Oscar Valencia  Procedure(s) Performed: XI ROBOTIC ASSISTED LAPAROSCOPIC RADICAL PROSTATECTOMY LEVEL 2; FLEXIBLE CYSTOSCOPY (N/A ) LYMPHADENECTOMY, PELVIC (Bilateral )  Patient Location: PACU  Anesthesia Type:General  Level of Consciousness: awake, alert  and oriented  Airway & Oxygen Therapy: Patient Spontanous Breathing and Patient connected to face mask oxygen  Post-op Assessment: Report given to RN, Post -op Vital signs reviewed and stable and Patient moving all extremities X 4  Post vital signs: Reviewed and stable  Last Vitals:  Vitals Value Taken Time  BP    Temp    Pulse 79 08/03/19 1049  Resp 12 08/03/19 1049  SpO2 99 % 08/03/19 1049  Vitals shown include unvalidated device data.  Last Pain:  Vitals:   08/03/19 0604  TempSrc: Oral  PainSc:          Complications: No complications documented.

## 2019-08-03 NOTE — Anesthesia Preprocedure Evaluation (Signed)
Anesthesia Evaluation  Patient identified by MRN, date of birth, ID band Patient awake    Reviewed: Allergy & Precautions, NPO status , Patient's Chart, lab work & pertinent test results  Airway Mallampati: I  TM Distance: >3 FB Neck ROM: Full    Dental   Pulmonary former smoker,    Pulmonary exam normal        Cardiovascular Normal cardiovascular exam     Neuro/Psych    GI/Hepatic GERD  Medicated and Controlled,  Endo/Other    Renal/GU      Musculoskeletal   Abdominal   Peds  Hematology   Anesthesia Other Findings   Reproductive/Obstetrics                             Anesthesia Physical Anesthesia Plan  ASA: II  Anesthesia Plan: General   Post-op Pain Management:    Induction: Intravenous  PONV Risk Score and Plan: 2 and Ondansetron and Midazolam  Airway Management Planned: Oral ETT  Additional Equipment:   Intra-op Plan:   Post-operative Plan: Extubation in OR  Informed Consent: I have reviewed the patients History and Physical, chart, labs and discussed the procedure including the risks, benefits and alternatives for the proposed anesthesia with the patient or authorized representative who has indicated his/her understanding and acceptance.       Plan Discussed with: CRNA and Surgeon  Anesthesia Plan Comments:         Anesthesia Quick Evaluation

## 2019-08-03 NOTE — Op Note (Signed)
Preoperative diagnosis: Clinically localized adenocarcinoma of the prostate (clinical stage T2a N0 Mx)  Postoperative diagnosis: Clinically localized adenocarcinoma of the prostate (clinical stage T2a N0 Mx)  Procedure:  1. Robotic assisted laparoscopic radical prostatectomy (bilateral nerve sparing) 2. Bilateral robotic assisted laparoscopic pelvic lymphadenectomy 3. Flexible cystoscopy  Surgeon: Pryor Curia. M.D.  Assistant: Debbrah Alar, PA-C  An assistant was required for this surgical procedure.  The duties of the assistant included but were not limited to suctioning, passing suture, camera manipulation, retraction. This procedure would not be able to be performed without an Environmental consultant.  Resident: Dr. Sharlot Gowda  Anesthesia: General  Complications: None  EBL: 275 mL  IVF:  1700 mL crystalloid  Specimens: 1. Prostate and seminal vesicles 2. Right pelvic lymph nodes 3. Left pelvic lymph nodes  Disposition of specimens: Pathology  Drains: 1. 20 Fr coude catheter 2. # 64 Blake pelvic drain  Indication: Oscar Valencia is a 69 y.o. year old patient with clinically localized prostate cancer.  After a thorough review of the management options for treatment of prostate cancer, he elected to proceed with surgical therapy and the above procedure(s).  We have discussed the potential benefits and risks of the procedure, side effects of the proposed treatment, the likelihood of the patient achieving the goals of the procedure, and any potential problems that might occur during the procedure or recuperation. Informed consent has been obtained.  Description of procedure:  The patient was taken to the operating room and a general anesthetic was administered. He was given preoperative antibiotics, placed in the dorsal lithotomy position, and prepped and draped in the usual sterile fashion. Next a preoperative timeout was performed. A urethral catheter was placed into the bladder and  a site was selected near the umbilicus for placement of the camera port. This was placed using a standard open Hassan technique which allowed entry into the peritoneal cavity under direct vision and without difficulty. An 8 mm robotic port was placed and a pneumoperitoneum established. The camera was then used to inspect the abdomen and there was no evidence of any intra-abdominal injuries or other abnormalities. The remaining abdominal ports were then placed. 8 mm robotic ports were placed in the right lower quadrant, left lower quadrant, and far left lateral abdominal wall. A 5 mm port was placed in the right upper quadrant and a 12 mm port was placed in the right lateral abdominal wall for laparoscopic assistance. All ports were placed under direct vision without difficulty. The surgical cart was then docked.   Utilizing the cautery scissors, the bladder was reflected posteriorly allowing entry into the space of Retzius and identification of the endopelvic fascia and prostate. The periprostatic fat was then removed from the prostate allowing full exposure of the endopelvic fascia. The endopelvic fascia was then incised from the apex back to the base of the prostate bilaterally and the underlying levator muscle fibers were swept laterally off the prostate thereby isolating the dorsal venous complex. The dorsal vein was then stapled and divided with a 45 mm Flex Echelon stapler. Attention then turned to the bladder neck which was divided anteriorly thereby allowing entry into the bladder and exposure of the urethral catheter. The catheter balloon was deflated and the catheter was brought into the operative field and used to retract the prostate anteriorly. The posterior bladder neck was then examined and was divided allowing further dissection between the bladder and prostate posteriorly until the vasa deferentia and seminal vessels were identified. The  vasa deferentia were isolated, divided, and lifted  anteriorly. The seminal vesicles were dissected down to their tips with care to control the seminal vascular arterial blood supply. These structures were then lifted anteriorly and the space between Denonvillier's fascia and the anterior rectum was developed with a combination of sharp and blunt dissection. This isolated the vascular pedicles of the prostate.  The lateral prostatic fascia was then sharply incised allowing release of the neurovascular bundles bilaterally. The vascular pedicles of the prostate were then ligated with Weck clips between the prostate and neurovascular bundles and divided with sharp cold scissor dissection resulting in neurovascular bundle preservation. The neurovascular bundles were then separated off the apex of the prostate and urethra bilaterally.  The urethra was then sharply transected allowing the prostate specimen to be disarticulated. The pelvis was copiously irrigated and hemostasis was ensured. There was no evidence for rectal injury.  Attention then turned to the right pelvic sidewall. The fibrofatty tissue between the external iliac vein, confluence of the iliac vessels, hypogastric artery, and Cooper's ligament was dissected free from the pelvic sidewall with care to preserve the obturator nerve. Weck clips were used for lymphostasis and hemostasis. An identical procedure was performed on the contralateral side and the lymphatic packets were removed for permanent pathologic analysis.  Attention then turned to the urethral anastomosis. A 2-0 Vicryl slip knot was placed between Denonvillier's fascia, the posterior bladder neck, and the posterior urethra to reapproximate these structures. A double-armed 3-0 Monocryl suture was then used to perform a 360 running tension-free anastomosis between the bladder neck and urethra. A new urethral catheter was then placed into the bladder and irrigated. However, the catheter was not seen entering the bladder.  After multiple  attempts, I performed flexible cystoscopy.  There was a false passage noted in the bulbar urethra.  I navigated the cystoscope into the bladder easily and placed a 0.38 Sensor guide wire.  I then place an 18 Fr Council catheter over the wire into the bladder and inflated the balloon with 15 cc of sterile water.  The bladder was irrigated. There were no blood clots within the bladder and the anastomosis appeared to be watertight. A #19 Blake drain was then brought through the left lateral 8 mm port site and positioned appropriately within the pelvis. It was secured to the skin with a nylon suture. The surgical cart was then undocked. The right lateral 12 mm port site was closed at the fascial level with a 0 Vicryl suture placed laparoscopically. All remaining ports were then removed under direct vision. The prostate specimen was removed intact within the Endopouch retrieval bag via the periumbilical camera port site. This fascial opening was closed with two running 0 Vicryl sutures. 0.25% Marcaine was then injected into all port sites and all incisions were reapproximated at the skin level with 4-0 Monocryl subcuticular sutures and Dermabond. The patient appeared to tolerate the procedure well and without complications. The patient was able to be extubated and transferred to the recovery unit in satisfactory condition.   Pryor Curia MD

## 2019-08-04 ENCOUNTER — Encounter (HOSPITAL_COMMUNITY): Payer: Self-pay | Admitting: Urology

## 2019-08-04 DIAGNOSIS — E785 Hyperlipidemia, unspecified: Secondary | ICD-10-CM | POA: Diagnosis not present

## 2019-08-04 DIAGNOSIS — C61 Malignant neoplasm of prostate: Secondary | ICD-10-CM | POA: Diagnosis not present

## 2019-08-04 DIAGNOSIS — M1990 Unspecified osteoarthritis, unspecified site: Secondary | ICD-10-CM | POA: Diagnosis not present

## 2019-08-04 DIAGNOSIS — Z87891 Personal history of nicotine dependence: Secondary | ICD-10-CM | POA: Diagnosis not present

## 2019-08-04 LAB — HEMOGLOBIN AND HEMATOCRIT, BLOOD
HCT: 36.4 % — ABNORMAL LOW (ref 39.0–52.0)
Hemoglobin: 12.1 g/dL — ABNORMAL LOW (ref 13.0–17.0)

## 2019-08-04 MED ORDER — TRAMADOL HCL 50 MG PO TABS
50.0000 mg | ORAL_TABLET | Freq: Four times a day (QID) | ORAL | Status: DC | PRN
  Administered 2019-08-04 (×2): 50 mg via ORAL
  Filled 2019-08-04 (×2): qty 1

## 2019-08-04 MED ORDER — BISACODYL 10 MG RE SUPP
10.0000 mg | Freq: Once | RECTAL | Status: AC
  Administered 2019-08-04: 10 mg via RECTAL
  Filled 2019-08-04: qty 1

## 2019-08-04 NOTE — Discharge Summary (Signed)
  Date of admission: 08/03/2019  Date of discharge: 08/04/2019  Admission diagnosis: Prostate Cancer  Discharge diagnosis: Prostate Cancer  History and Physical: For full details, please see admission history and physical. Briefly, Oscar Valencia is a 69 y.o. gentleman with localized prostate cancer.  After discussing management/treatment options, he elected to proceed with surgical treatment.  Hospital Course: Oscar Valencia was taken to the operating room on 08/03/2019 and underwent a robotic assisted laparoscopic radical prostatectomy. He tolerated this procedure well and without complications. Postoperatively, he was able to be transferred to a regular hospital room following recovery from anesthesia.  He was able to begin ambulating the night of surgery. He remained hemodynamically stable overnight.  He had excellent urine output with appropriately minimal output from his pelvic drain and his pelvic drain was removed on POD #1.  He was transitioned to oral pain medication, tolerated a clear liquid diet, and had met all discharge criteria and was able to be discharged home later on POD#1.  Laboratory values:  Recent Labs    08/03/19 1058 08/04/19 0523  HGB 14.3 12.1*  HCT 43.3 36.4*    Disposition: Home  Discharge instruction: He was instructed to be ambulatory but to refrain from heavy lifting, strenuous activity, or driving. He was instructed on urethral catheter care.  Discharge medications:   Allergies as of 08/04/2019      Reactions   Penicillins Other (See Comments)   Unknown-childhood allergy, does not know what happens with supposed allergy.      Medication List    TAKE these medications   atorvastatin 20 MG tablet Commonly known as: LIPITOR Take 20 mg by mouth at bedtime.   sulfamethoxazole-trimethoprim 800-160 MG tablet Commonly known as: BACTRIM DS Take 1 tablet by mouth 2 (two) times daily. Start the day prior to foley removal appointment   traMADol 50 MG  tablet Commonly known as: Ultram Take 1-2 tablets (50-100 mg total) by mouth every 6 (six) hours as needed for moderate pain or severe pain.       Followup: He will followup in 1 week for catheter removal and to discuss his surgical pathology results.

## 2019-08-04 NOTE — Progress Notes (Signed)
Patient ID: Oscar Valencia, male   DOB: [birth year], 69 y.o.   MRN: [id number]  1 Day Post-Op Subjective: The patient is doing well.  No nausea or vomiting. Pain is adequately controlled.  Objective: Vital signs in last 24 hours: Temp:  [97.6 F (36.4 C)-98.7 F (37.1 C)] 98.7 F (37.1 C) (06/15 0342) Pulse Rate:  [60-83] 68 (06/15 0342) Resp:  [8-18] 17 (06/15 0342) BP: (123-172)/(68-110) 123/68 (06/15 0342) SpO2:  [96 %-100 %] 97 % (06/15 0342)  Intake/Output from previous day: 06/14 0701 - 06/15 0700 In: 4533.5 [P.O.:840; I.V.:3467.7; IV Piggyback:105.8] Out: 4305 [Urine:3850; Drains:180; Blood:275] Intake/Output this shift: No intake/output data recorded.  Physical Exam:  General: Alert and oriented. CV: RRR Lungs: Clear bilaterally. GI: Soft, Nondistended. Incisions: Clean, dry, and intact Urine: Clear Extremities: Nontender, no erythema, no edema.  Lab Results: Recent Labs    08/03/19 1058 08/04/19 0523  HGB 14.3 12.1*  HCT 43.3 36.4*      Assessment/Plan: POD# 1 s/p robotic prostatectomy.  1) SL IVF 2) Ambulate, Incentive spirometry 3) Transition to oral pain medication 4) Dulcolax suppository 5) D/C pelvic drain 6) Plan for likely discharge later today   Oscar Valencia. MD   LOS: 0 days   Oscar Valencia 08/04/2019, 7:45 AM

## 2019-08-04 NOTE — Plan of Care (Signed)

## 2019-08-04 NOTE — Care Management Obs Status (Signed)
[place] NOTIFICATION   Patient Details  Name: Oscar Valencia MRN: [id number] Date of Birth: Dec 25, 1950   Medicare Observation Status Notification Given:  Yes    MahabirJuliann Pulse, RN 08/04/2019, 2:11 PM

## 2019-08-06 LAB — SURGICAL PATHOLOGY

## 2019-08-31 DIAGNOSIS — M6281 Muscle weakness (generalized): Secondary | ICD-10-CM | POA: Diagnosis not present

## 2019-08-31 DIAGNOSIS — M62838 Other muscle spasm: Secondary | ICD-10-CM | POA: Diagnosis not present

## 2019-08-31 DIAGNOSIS — N393 Stress incontinence (female) (male): Secondary | ICD-10-CM | POA: Diagnosis not present

## 2019-09-14 DIAGNOSIS — N393 Stress incontinence (female) (male): Secondary | ICD-10-CM | POA: Diagnosis not present

## 2019-09-14 DIAGNOSIS — M6281 Muscle weakness (generalized): Secondary | ICD-10-CM | POA: Diagnosis not present

## 2019-09-14 DIAGNOSIS — M62838 Other muscle spasm: Secondary | ICD-10-CM | POA: Diagnosis not present

## 2019-11-20 DIAGNOSIS — C61 Malignant neoplasm of prostate: Secondary | ICD-10-CM | POA: Diagnosis not present

## 2019-11-20 DIAGNOSIS — N5201 Erectile dysfunction due to arterial insufficiency: Secondary | ICD-10-CM | POA: Diagnosis not present

## 2019-11-20 DIAGNOSIS — N393 Stress incontinence (female) (male): Secondary | ICD-10-CM | POA: Diagnosis not present

## 2019-12-25 DIAGNOSIS — Z125 Encounter for screening for malignant neoplasm of prostate: Secondary | ICD-10-CM | POA: Diagnosis not present

## 2019-12-25 DIAGNOSIS — E785 Hyperlipidemia, unspecified: Secondary | ICD-10-CM | POA: Diagnosis not present

## 2020-01-01 DIAGNOSIS — R82998 Other abnormal findings in urine: Secondary | ICD-10-CM | POA: Diagnosis not present

## 2020-01-25 DIAGNOSIS — Z1212 Encounter for screening for malignant neoplasm of rectum: Secondary | ICD-10-CM | POA: Diagnosis not present

## 2020-01-27 DIAGNOSIS — Z85828 Personal history of other malignant neoplasm of skin: Secondary | ICD-10-CM | POA: Diagnosis not present

## 2020-01-27 DIAGNOSIS — C44629 Squamous cell carcinoma of skin of left upper limb, including shoulder: Secondary | ICD-10-CM | POA: Diagnosis not present

## 2020-01-27 DIAGNOSIS — D485 Neoplasm of uncertain behavior of skin: Secondary | ICD-10-CM | POA: Diagnosis not present

## 2020-03-15 DIAGNOSIS — L570 Actinic keratosis: Secondary | ICD-10-CM | POA: Diagnosis not present

## 2020-03-15 DIAGNOSIS — Z85828 Personal history of other malignant neoplasm of skin: Secondary | ICD-10-CM | POA: Diagnosis not present

## 2020-03-15 DIAGNOSIS — L821 Other seborrheic keratosis: Secondary | ICD-10-CM | POA: Diagnosis not present

## 2020-03-15 DIAGNOSIS — D485 Neoplasm of uncertain behavior of skin: Secondary | ICD-10-CM | POA: Diagnosis not present

## 2020-03-15 DIAGNOSIS — L814 Other melanin hyperpigmentation: Secondary | ICD-10-CM | POA: Diagnosis not present

## 2020-05-24 DIAGNOSIS — C61 Malignant neoplasm of prostate: Secondary | ICD-10-CM | POA: Diagnosis not present

## 2020-05-31 DIAGNOSIS — N5201 Erectile dysfunction due to arterial insufficiency: Secondary | ICD-10-CM | POA: Diagnosis not present

## 2020-05-31 DIAGNOSIS — C61 Malignant neoplasm of prostate: Secondary | ICD-10-CM | POA: Diagnosis not present

## 2020-07-14 DIAGNOSIS — H5212 Myopia, left eye: Secondary | ICD-10-CM | POA: Diagnosis not present

## 2020-08-03 DIAGNOSIS — R1030 Lower abdominal pain, unspecified: Secondary | ICD-10-CM | POA: Diagnosis not present

## 2020-08-03 DIAGNOSIS — R1909 Other intra-abdominal and pelvic swelling, mass and lump: Secondary | ICD-10-CM | POA: Diagnosis not present

## 2020-08-04 DIAGNOSIS — R1909 Other intra-abdominal and pelvic swelling, mass and lump: Secondary | ICD-10-CM | POA: Diagnosis not present

## 2020-09-26 DIAGNOSIS — R1031 Right lower quadrant pain: Secondary | ICD-10-CM | POA: Diagnosis not present

## 2020-09-26 DIAGNOSIS — K4090 Unilateral inguinal hernia, without obstruction or gangrene, not specified as recurrent: Secondary | ICD-10-CM | POA: Diagnosis not present

## 2020-09-30 DIAGNOSIS — K4090 Unilateral inguinal hernia, without obstruction or gangrene, not specified as recurrent: Secondary | ICD-10-CM | POA: Diagnosis not present

## 2020-11-09 DIAGNOSIS — K4090 Unilateral inguinal hernia, without obstruction or gangrene, not specified as recurrent: Secondary | ICD-10-CM | POA: Diagnosis not present

## 2020-12-17 DIAGNOSIS — Z23 Encounter for immunization: Secondary | ICD-10-CM | POA: Diagnosis not present

## 2021-01-11 IMAGING — MR MR PROSTATE WO/W CM
56 series · 56 of 56 positions shown · IV contrast (15 ml multihance)
Comparison: None
COMPARISON: None

Addendum:
CLINICAL DATA: Biopsy on [DATE] with multiple abnormal cores

EXAM:
MR PROSTATE WITHOUT AND WITH CONTRAST
TECHNIQUE: Multiplanar multisequence MRI images were obtained of the pelvis
centered about the prostate. Pre and post contrast images were
obtained.
CONTRAST:  15mL MULTIHANCE GADOBENATE DIMEGLUMINE 529 MG/ML IV SOLN

[Series 3: bSSFP fat-sat · axial · 8.0mm · 0.74mm/px · 1 of 28 slices shown]
[im 1/28]
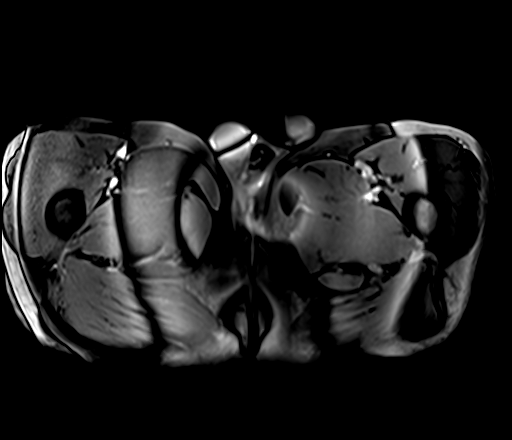

[Series 4: T1 · axial · 5.0mm · 1.25mm/px · 1 of 80 slices shown]
[im 1/80]
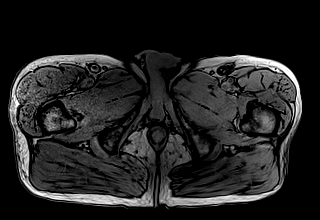

[Series 5: T2 · coronal · 3.5mm · 0.56mm/px · 1 of 23 slices shown (1 of 3)]
[im 1/23]
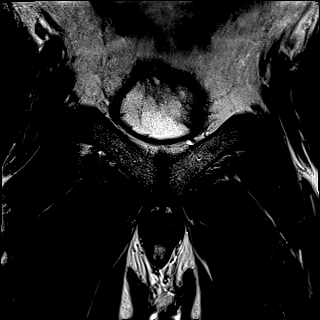

[Series 6: DWI · axial · 3.5mm · 1.75mm/px · 1 of 60 slices shown (1 of 3)]
[im 1/60]
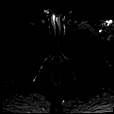

[Series 7: DWI · axial · 3.5mm · 1.75mm/px · 1 of 20 slices shown (2 of 3)]
[im 1/20]
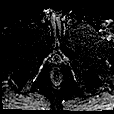

[Series 8: DWI · axial · 3.5mm · 1.56mm/px · 1 of 20 slices shown (3 of 3)]
[im 1/20]
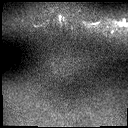

[Series 9: T2 · axial · 3.5mm · 0.56mm/px · 1 of 23 slices shown (2 of 3)]
[im 1/23]
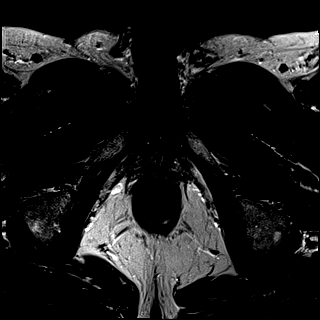

[Series 10: T2 · axial · 1.0mm · 1.04mm/px · 1 of 80 slices shown (3 of 3)]
[im 1/80]
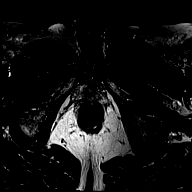

[Series 11: pre t1_twist_tra_dyn_ttc=5.3s · axial · non-contrast · 3.5mm · 0.83mm/px · 1 of 20 slices shown]
[im 1/20]
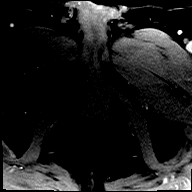

[Series 12: post t1_twist_tra_dyn-copy center · axial · 3.5mm · 0.83mm/px · 1 of 20 slices shown (1 of 24)]
[im 1/20]
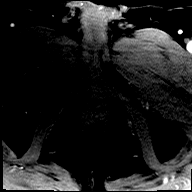

[Series 13: post t1_twist_tra_dyn-copy center · axial · 3.5mm · 0.83mm/px · 1 of 20 slices shown (2 of 24)]
[im 1/20]
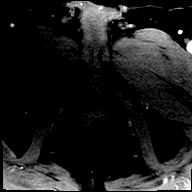

[Series 14: post t1_twist_tra_dyn-copy cent_sub_ttc=(id) · axial · 3.5mm · 0.83mm/px · 1 of 20 slices shown (1 of 23)]
[im 1/20]
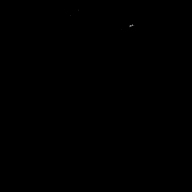

[Series 15: post t1_twist_tra_dyn-copy center · axial · 3.5mm · 0.83mm/px · 1 of 20 slices shown (3 of 24)]
[im 1/20]
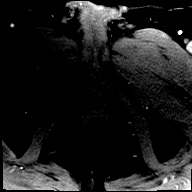

[Series 16: post t1_twist_tra_dyn-copy cent_sub_ttc=(id) · axial · 3.5mm · 0.83mm/px · 1 of 20 slices shown (2 of 23)]
[im 1/20]
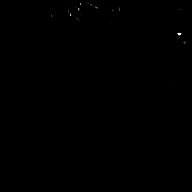

[Series 17: post t1_twist_tra_dyn-copy center · axial · 3.5mm · 0.83mm/px · 1 of 20 slices shown (4 of 24)]
[im 1/20]
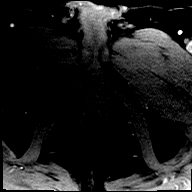

[Series 18: post t1_twist_tra_dyn-copy cent_sub_ttc=(id) · axial · 3.5mm · 0.83mm/px · 1 of 20 slices shown (3 of 23)]
[im 1/20]
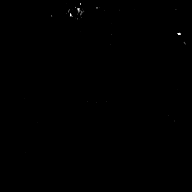

[Series 19: post t1_twist_tra_dyn-copy center · axial · 3.5mm · 0.83mm/px · 1 of 20 slices shown (5 of 24)]
[im 1/20]
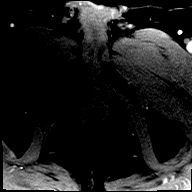

[Series 20: post t1_twist_tra_dyn-copy cent_sub_ttc=(id) · axial · 3.5mm · 0.83mm/px · 1 of 20 slices shown (4 of 23)]
[im 1/20]
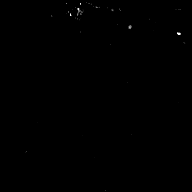

[Series 21: post t1_twist_tra_dyn-copy center · axial · 3.5mm · 0.83mm/px · 1 of 20 slices shown (6 of 24)]
[im 1/20]
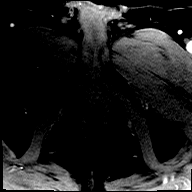

[Series 22: post t1_twist_tra_dyn-copy cent_sub_ttc=(id) · axial · 3.5mm · 0.83mm/px · 1 of 20 slices shown (5 of 23)]
[im 1/20]
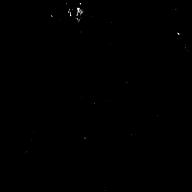

[Series 23: post t1_twist_tra_dyn-copy center · axial · 3.5mm · 0.83mm/px · 1 of 20 slices shown (7 of 24)]
[im 1/20]
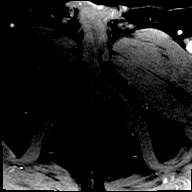

[Series 24: post t1_twist_tra_dyn-copy cent_sub_ttc=(id) · axial · 3.5mm · 0.83mm/px · 1 of 20 slices shown (6 of 23)]
[im 1/20]
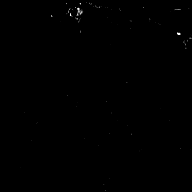

[Series 25: post t1_twist_tra_dyn-copy center · axial · 3.5mm · 0.83mm/px · 1 of 20 slices shown (8 of 24)]
[im 1/20]
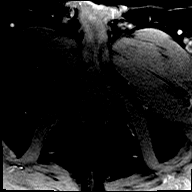

[Series 26: post t1_twist_tra_dyn-copy cent_sub_ttc=(id) · axial · 3.5mm · 0.83mm/px · 1 of 20 slices shown (7 of 23)]
[im 1/20]
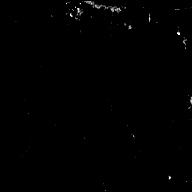

[Series 27: post t1_twist_tra_dyn-copy center · axial · 3.5mm · 0.83mm/px · 1 of 20 slices shown (9 of 24)]
[im 1/20]
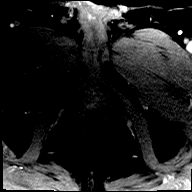

[Series 28: post t1_twist_tra_dyn-copy cent_sub_ttc=(id) · axial · 3.5mm · 0.83mm/px · 1 of 20 slices shown (8 of 23)]
[im 1/20]
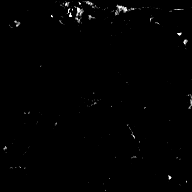

[Series 29: post t1_twist_tra_dyn-copy center · axial · 3.5mm · 0.83mm/px · 1 of 20 slices shown (10 of 24)]
[im 1/20]
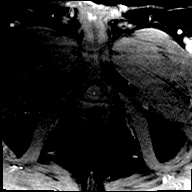

[Series 30: post t1_twist_tra_dyn-copy cent_sub_ttc=(id) · axial · 3.5mm · 0.83mm/px · 1 of 20 slices shown (9 of 23)]
[im 1/20]
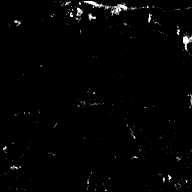

[Series 31: post t1_twist_tra_dyn-copy center · axial · 3.5mm · 0.83mm/px · 1 of 20 slices shown (11 of 24)]
[im 1/20]
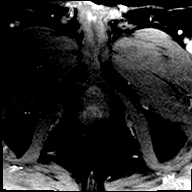

[Series 32: post t1_twist_tra_dyn-copy cent_sub_ttc=(id) · axial · 3.5mm · 0.83mm/px · 1 of 20 slices shown (10 of 23)]
[im 1/20]
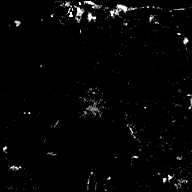

[Series 33: post t1_twist_tra_dyn-copy center · axial · 3.5mm · 0.83mm/px · 1 of 20 slices shown (12 of 24)]
[im 1/20]
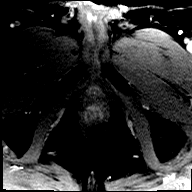

[Series 34: post t1_twist_tra_dyn-copy cent_sub_ttc=(id) · axial · 3.5mm · 0.83mm/px · 1 of 20 slices shown (11 of 23)]
[im 1/20]
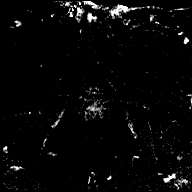

[Series 35: post t1_twist_tra_dyn-copy center · axial · 3.5mm · 0.83mm/px · 1 of 20 slices shown (13 of 24)]
[im 1/20]
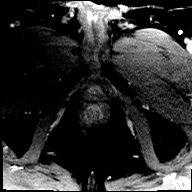

[Series 36: post t1_twist_tra_dyn-copy cent_sub_ttc=(id) · axial · 3.5mm · 0.83mm/px · 1 of 20 slices shown (12 of 23)]
[im 1/20]
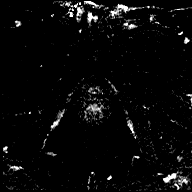

[Series 37: post t1_twist_tra_dyn-copy center · axial · 3.5mm · 0.83mm/px · 1 of 20 slices shown (14 of 24)]
[im 1/20]
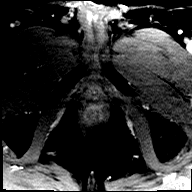

[Series 38: post t1_twist_tra_dyn-copy cent_sub_ttc=(id) · axial · 3.5mm · 0.83mm/px · 1 of 20 slices shown (13 of 23)]
[im 1/20]
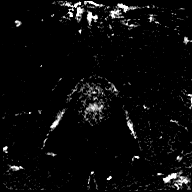

[Series 39: post t1_twist_tra_dyn-copy center · axial · 3.5mm · 0.83mm/px · 1 of 20 slices shown (15 of 24)]
[im 1/20]
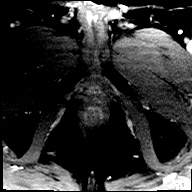

[Series 40: post t1_twist_tra_dyn-copy cent_sub_ttc=(id) · axial · 3.5mm · 0.83mm/px · 1 of 20 slices shown (14 of 23)]
[im 1/20]
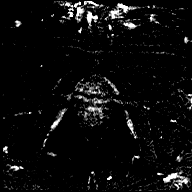

[Series 41: post t1_twist_tra_dyn-copy center · axial · 3.5mm · 0.83mm/px · 1 of 20 slices shown (16 of 24)]
[im 1/20]
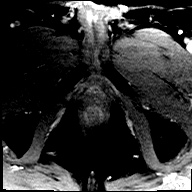

[Series 42: post t1_twist_tra_dyn-copy cent_sub_ttc=(id) · axial · 3.5mm · 0.83mm/px · 1 of 20 slices shown (15 of 23)]
[im 1/20]
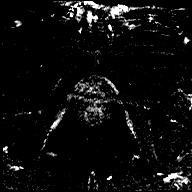

[Series 43: post t1_twist_tra_dyn-copy center · axial · 3.5mm · 0.83mm/px · 1 of 20 slices shown (17 of 24)]
[im 1/20]
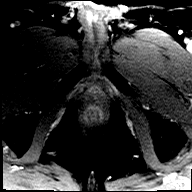

[Series 44: post t1_twist_tra_dyn-copy cent_sub_ttc=(id) · axial · 3.5mm · 0.83mm/px · 1 of 20 slices shown (16 of 23)]
[im 1/20]
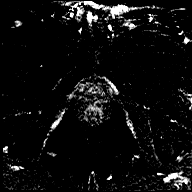

[Series 45: post t1_twist_tra_dyn-copy center · axial · 3.5mm · 0.83mm/px · 1 of 20 slices shown (18 of 24)]
[im 1/20]
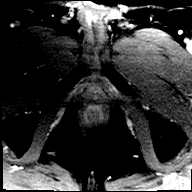

[Series 46: post t1_twist_tra_dyn-copy cent_sub_ttc=(id) · axial · 3.5mm · 0.83mm/px · 1 of 20 slices shown (17 of 23)]
[im 1/20]
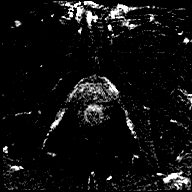

[Series 47: post t1_twist_tra_dyn-copy center · axial · 3.5mm · 0.83mm/px · 1 of 20 slices shown (19 of 24)]
[im 1/20]
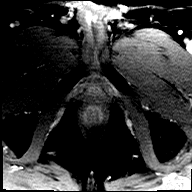

[Series 48: post t1_twist_tra_dyn-copy cent_sub_ttc=(id) · axial · 3.5mm · 0.83mm/px · 1 of 20 slices shown (18 of 23)]
[im 1/20]
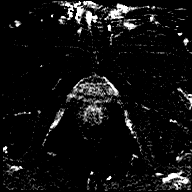

[Series 49: post t1_twist_tra_dyn-copy center · axial · 3.5mm · 0.83mm/px · 1 of 20 slices shown (20 of 24)]
[im 1/20]
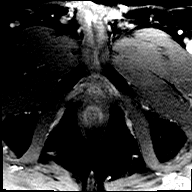

[Series 50: post t1_twist_tra_dyn-copy cent_sub_ttc=(id) · axial · 3.5mm · 0.83mm/px · 1 of 20 slices shown (19 of 23)]
[im 1/20]
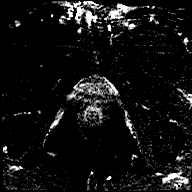

[Series 51: post t1_twist_tra_dyn-copy center · axial · 3.5mm · 0.83mm/px · 1 of 20 slices shown (21 of 24)]
[im 1/20]
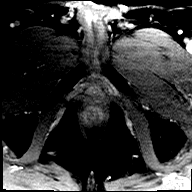

[Series 52: post t1_twist_tra_dyn-copy cent_sub_ttc=(id) · axial · 3.5mm · 0.83mm/px · 1 of 20 slices shown (20 of 23)]
[im 1/20]
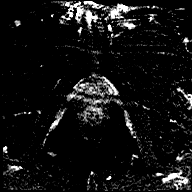

[Series 53: post t1_twist_tra_dyn-copy center · axial · 3.5mm · 0.83mm/px · 1 of 20 slices shown (22 of 24)]
[im 1/20]
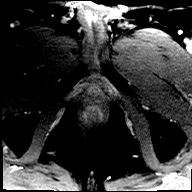

[Series 54: post t1_twist_tra_dyn-copy cent_sub_ttc=(id) · axial · 3.5mm · 0.83mm/px · 1 of 20 slices shown (21 of 23)]
[im 1/20]
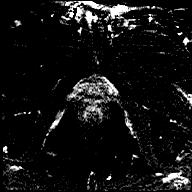

[Series 55: post t1_twist_tra_dyn-copy center · axial · 3.5mm · 0.83mm/px · 1 of 20 slices shown (23 of 24)]
[im 1/20]
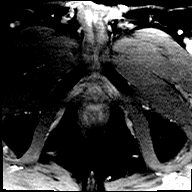

[Series 56: post t1_twist_tra_dyn-copy cent_sub_ttc=(id) · axial · 3.5mm · 0.83mm/px · 1 of 20 slices shown (22 of 23)]
[im 1/20]
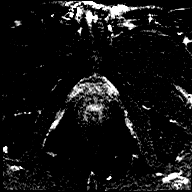

[Series 57: post t1_twist_tra_dyn-copy center · axial · 3.5mm · 0.83mm/px · 1 of 20 slices shown (24 of 24)]
[im 1/20]
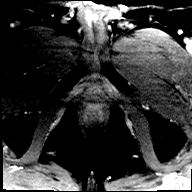

[Series 58: post t1_twist_tra_dyn-copy cent_sub_ttc=(id) · axial · 3.5mm · 0.83mm/px · 1 of 20 slices shown (23 of 23)]
[im 1/20]
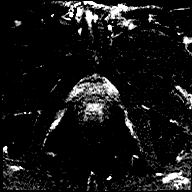

[56 of 56 positions shown; findings below may reference images not displayed]

FINDINGS: Prostate:

Large amount of rectal gas which markedly limits assessment. This
shows mass effect upon the posterior margin of the prostate and
causes susceptibility artifact on all sequences. This is improved on
high-resolution sequences and in particular the isotropic sequence
but near the inferior margin (image 61 of series 10) there is a
lenticular area in the posterior peripheral zone in the tracks
towards the apex, not associated with post biopsy hemorrhage neck
cannot be assessed on either diffusion or postcontrast images in
shows bulging of the capsule extending towards the upper margin of
the sphincter complex.

Volume: 3.7 x 2.5 x 4.2 (volume = 20 cc) cm

Transcapsular spread: Equivocal with suspicion for extracapsular
disease along the posterior prostate apex just above the internal
sphincter complex.

Seminal vesicle involvement: Absent

Neurovascular bundle involvement: Absent

Pelvic adenopathy: Absent

Bone metastasis: Absent

Other findings: Colonic diverticulosis.
IMPRESSION: Lesion suspected at the prostate apex posteriorly near the upper
margin of the sphincter complex anterior to the rectum with question
of extracapsular disease. This area is not well assessed due to
abundance of rectal gas. PIRADS not assigned given the abundant
artifact on both postcontrast and diffusion-weighted imaging. The
patient will be recalled for repeat imaging in an attempt to better
define this potential abnormality which is in the area of reported
cancer based on recent biopsy results.

ADDENDUM:
Area of abnormality along the prostate apex with limited assessment
due to susceptibility. No gross extracapsular extension. There is no
need for repeat imaging.

*** End of Addendum ***
FINDINGS: Prostate:

Large amount of rectal gas which markedly limits assessment. This
shows mass effect upon the posterior margin of the prostate and
causes susceptibility artifact on all sequences. This is improved on
high-resolution sequences and in particular the isotropic sequence
but near the inferior margin (image 61 of series 10) there is a
lenticular area in the posterior peripheral zone in the tracks
towards the apex, not associated with post biopsy hemorrhage neck
cannot be assessed on either diffusion or postcontrast images in
shows bulging of the capsule extending towards the upper margin of
the sphincter complex.

Volume: 3.7 x 2.5 x 4.2 (volume = 20 cc) cm

Transcapsular spread: Equivocal with suspicion for extracapsular
disease along the posterior prostate apex just above the internal
sphincter complex.

Seminal vesicle involvement: Absent

Neurovascular bundle involvement: Absent

Pelvic adenopathy: Absent

Bone metastasis: Absent

Other findings: Colonic diverticulosis.
IMPRESSION: Lesion suspected at the prostate apex posteriorly near the upper
margin of the sphincter complex anterior to the rectum with question
of extracapsular disease. This area is not well assessed due to
abundance of rectal gas. PIRADS not assigned given the abundant
artifact on both postcontrast and diffusion-weighted imaging. The
patient will be recalled for repeat imaging in an attempt to better
define this potential abnormality which is in the area of reported
cancer based on recent biopsy results.

## 2021-01-13 DIAGNOSIS — E785 Hyperlipidemia, unspecified: Secondary | ICD-10-CM | POA: Diagnosis not present

## 2021-01-13 DIAGNOSIS — Z125 Encounter for screening for malignant neoplasm of prostate: Secondary | ICD-10-CM | POA: Diagnosis not present

## 2021-01-16 DIAGNOSIS — R82998 Other abnormal findings in urine: Secondary | ICD-10-CM | POA: Diagnosis not present

## 2021-02-21 DIAGNOSIS — N5201 Erectile dysfunction due to arterial insufficiency: Secondary | ICD-10-CM | POA: Diagnosis not present

## 2021-02-21 DIAGNOSIS — C61 Malignant neoplasm of prostate: Secondary | ICD-10-CM | POA: Diagnosis not present

## 2021-03-15 DIAGNOSIS — D2262 Melanocytic nevi of left upper limb, including shoulder: Secondary | ICD-10-CM | POA: Diagnosis not present

## 2021-03-15 DIAGNOSIS — L570 Actinic keratosis: Secondary | ICD-10-CM | POA: Diagnosis not present

## 2021-03-15 DIAGNOSIS — Z85828 Personal history of other malignant neoplasm of skin: Secondary | ICD-10-CM | POA: Diagnosis not present

## 2021-03-15 DIAGNOSIS — L821 Other seborrheic keratosis: Secondary | ICD-10-CM | POA: Diagnosis not present

## 2021-08-17 DIAGNOSIS — C61 Malignant neoplasm of prostate: Secondary | ICD-10-CM | POA: Diagnosis not present

## 2022-01-15 DIAGNOSIS — E785 Hyperlipidemia, unspecified: Secondary | ICD-10-CM | POA: Diagnosis not present

## 2022-01-15 DIAGNOSIS — Z125 Encounter for screening for malignant neoplasm of prostate: Secondary | ICD-10-CM | POA: Diagnosis not present

## 2022-01-18 DIAGNOSIS — R82998 Other abnormal findings in urine: Secondary | ICD-10-CM | POA: Diagnosis not present

## 2022-03-20 DIAGNOSIS — L570 Actinic keratosis: Secondary | ICD-10-CM | POA: Diagnosis not present

## 2022-03-20 DIAGNOSIS — L723 Sebaceous cyst: Secondary | ICD-10-CM | POA: Diagnosis not present

## 2022-03-20 DIAGNOSIS — D225 Melanocytic nevi of trunk: Secondary | ICD-10-CM | POA: Diagnosis not present

## 2022-03-20 DIAGNOSIS — C44622 Squamous cell carcinoma of skin of right upper limb, including shoulder: Secondary | ICD-10-CM | POA: Diagnosis not present

## 2022-03-20 DIAGNOSIS — L821 Other seborrheic keratosis: Secondary | ICD-10-CM | POA: Diagnosis not present

## 2022-03-20 DIAGNOSIS — Z85828 Personal history of other malignant neoplasm of skin: Secondary | ICD-10-CM | POA: Diagnosis not present

## 2022-06-15 ENCOUNTER — Other Ambulatory Visit: Payer: Self-pay

## 2022-06-15 ENCOUNTER — Emergency Department (HOSPITAL_BASED_OUTPATIENT_CLINIC_OR_DEPARTMENT_OTHER)
Admission: EM | Admit: 2022-06-15 | Discharge: 2022-06-15 | Disposition: A | Discharge status: Home / Self Care | Payer: Medicare Other | Attending: Emergency Medicine | Admitting: Emergency Medicine

## 2022-06-15 ENCOUNTER — Emergency Department (HOSPITAL_BASED_OUTPATIENT_CLINIC_OR_DEPARTMENT_OTHER): Payer: Medicare Other

## 2022-06-15 DIAGNOSIS — Z8546 Personal history of malignant neoplasm of prostate: Secondary | ICD-10-CM | POA: Insufficient documentation

## 2022-06-15 DIAGNOSIS — K5732 Diverticulitis of large intestine without perforation or abscess without bleeding: Secondary | ICD-10-CM | POA: Insufficient documentation

## 2022-06-15 DIAGNOSIS — R109 Unspecified abdominal pain: Secondary | ICD-10-CM | POA: Diagnosis present

## 2022-06-15 DIAGNOSIS — Z85828 Personal history of other malignant neoplasm of skin: Secondary | ICD-10-CM | POA: Diagnosis not present

## 2022-06-15 DIAGNOSIS — K5792 Diverticulitis of intestine, part unspecified, without perforation or abscess without bleeding: Secondary | ICD-10-CM

## 2022-06-15 LAB — URINALYSIS, ROUTINE W REFLEX MICROSCOPIC
Bilirubin Urine: NEGATIVE
Glucose, UA: NEGATIVE mg/dL
Hgb urine dipstick: NEGATIVE
Ketones, ur: 15 mg/dL — AB
Leukocytes,Ua: NEGATIVE
Nitrite: NEGATIVE
Protein, ur: NEGATIVE mg/dL
Specific Gravity, Urine: 1.011 (ref 1.005–1.030)
pH: 6 (ref 5.0–8.0)

## 2022-06-15 LAB — CBC
HCT: 50.3 % (ref 39.0–52.0)
Hemoglobin: 16.8 g/dL (ref 13.0–17.0)
MCH: 30.9 pg (ref 26.0–34.0)
MCHC: 33.4 g/dL (ref 30.0–36.0)
MCV: 92.6 fL (ref 80.0–100.0)
Platelets: 249 10*3/uL (ref 150–400)
RBC: 5.43 MIL/uL (ref 4.22–5.81)
RDW: 12.8 % (ref 11.5–15.5)
WBC: 16.4 10*3/uL — ABNORMAL HIGH (ref 4.0–10.5)
nRBC: 0 % (ref 0.0–0.2)

## 2022-06-15 LAB — COMPREHENSIVE METABOLIC PANEL
ALT: 26 U/L (ref 0–44)
AST: 26 U/L (ref 15–41)
Albumin: 4.6 g/dL (ref 3.5–5.0)
Alkaline Phosphatase: 48 U/L (ref 38–126)
Anion gap: 13 (ref 5–15)
BUN: 11 mg/dL (ref 8–23)
CO2: 24 mmol/L (ref 22–32)
Calcium: 10.1 mg/dL (ref 8.9–10.3)
Chloride: 102 mmol/L (ref 98–111)
Creatinine, Ser: 0.71 mg/dL (ref 0.61–1.24)
GFR, Estimated: >60 mL/min (ref >60)
Glucose, Bld: 101 mg/dL — ABNORMAL HIGH (ref 70–99)
Potassium: 3.7 mmol/L (ref 3.5–5.1)
Sodium: 139 mmol/L (ref 135–145)
Total Bilirubin: 1.2 mg/dL (ref 0.3–1.2)
Total Protein: 7.7 g/dL (ref 6.5–8.1)

## 2022-06-15 LAB — LIPASE, BLOOD: Lipase: 13 U/L (ref 11–51)

## 2022-06-15 LAB — TROPONIN I (HIGH SENSITIVITY): Troponin I (High Sensitivity): 8 ng/L (ref <18)

## 2022-06-15 MED ORDER — SODIUM CHLORIDE 0.9 % IV BOLUS
500.0000 mL | Freq: Once | INTRAVENOUS | Status: AC
  Administered 2022-06-15: 500 mL via INTRAVENOUS

## 2022-06-15 MED ORDER — IOHEXOL 300 MG/ML  SOLN
100.0000 mL | Freq: Once | INTRAMUSCULAR | Status: AC | PRN
  Administered 2022-06-15: 100 mL via INTRAVENOUS

## 2022-06-15 MED ORDER — AMOXICILLIN-POT CLAVULANATE 875-125 MG PO TABS
1.0000 | ORAL_TABLET | Freq: Once | ORAL | Status: AC
  Administered 2022-06-15: 1 via ORAL
  Filled 2022-06-15: qty 1

## 2022-06-15 MED ORDER — AMOXICILLIN-POT CLAVULANATE 875-125 MG PO TABS: 1.0000 | ORAL_TABLET | Freq: Two times a day (BID) | ORAL | 0 refills | Status: AC

## 2022-06-15 MED ORDER — ONDANSETRON HCL 4 MG/2ML IJ SOLN
4.0000 mg | Freq: Once | INTRAMUSCULAR | Status: AC
  Administered 2022-06-15: 4 mg via INTRAVENOUS
  Filled 2022-06-15: qty 2

## 2022-06-15 NOTE — ED Notes (Addendum)
MD at bedside for update. Patient to be given antibiotics for diverticulitis then dc home.

## 2022-06-15 NOTE — ED Provider Notes (Signed)
Emergency Department Provider Note   I have reviewed the triage vital signs and the nursing notes.   HISTORY  Chief Complaint Abdominal Pain   HPI Oscar Valencia is a 72 y.o. male with PMH of GERD, HLD, and prostate cancer presents emergency department with lower abdominal pain starting yesterday.  He had pain started shortly after eating.  No vomiting but has had some red-colored stool.  No fevers or chills.  No dysuria, hesitancy, urgency.  No chest pain or shortness of breath although has had some mild diaphoresis and nausea. No similar symptoms in the past. No sick contacts.    Past Medical History:  Diagnosis Date   Cataract    surgery to remove   GERD (gastroesophageal reflux disease)    Hyperlipidemia    Prostate cancer (HCC)    Skin cancer    UTI (urinary tract infection)     Review of Systems  Constitutional: No fever/chills Cardiovascular: Denies chest pain. Respiratory: Denies shortness of breath. Gastrointestinal: Positive abdominal pain. Positive nausea, no vomiting.  No diarrhea.   Musculoskeletal: Negative for back pain. Skin: Negative for rash. Neurological: Negative for headaches, focal weakness or numbness.  ____________________________________________   PHYSICAL EXAM:  VITAL SIGNS: ED Triage Vitals [06/15/22 1701]  Enc Vitals Group     BP (!) 163/122     Pulse Rate 86     Resp 16     Temp 98.4 F (36.9 C)     Temp Source Oral     SpO2 100 %   Constitutional: Alert and oriented. Well appearing and in no acute distress. Eyes: Conjunctivae are normal.  Head: Atraumatic. Nose: No congestion/rhinnorhea. Mouth/Throat: Mucous membranes are moist.   Neck: No stridor.  Cardiovascular: Normal rate, regular rhythm. Good peripheral circulation. Grossly normal heart sounds.   Respiratory: Normal respiratory effort.  No retractions. Lungs CTAB. Gastrointestinal: Soft with periumbilical tenderness and lower abdominal tenderness worse on the LLQ. No  distention.  Musculoskeletal: No lower extremity tenderness nor edema. No gross deformities of extremities. Neurologic:  Normal speech and language. No gross focal neurologic deficits are appreciated.  Skin:  Skin is warm, dry and intact. No rash noted.  ____________________________________________   LABS (all labs ordered are listed, but only abnormal results are displayed)  Labs Reviewed  CBC - Abnormal; Notable for the following components:      Result Value   WBC 16.4 (*)    All other components within normal limits  COMPREHENSIVE METABOLIC PANEL - Abnormal; Notable for the following components:   Glucose, Bld 101 (*)    All other components within normal limits  URINALYSIS, ROUTINE W REFLEX MICROSCOPIC - Abnormal; Notable for the following components:   Ketones, ur 15 (*)    All other components within normal limits  LIPASE, BLOOD  OCCULT BLOOD X 1 CARD TO LAB, STOOL  ABO/RH  TROPONIN I (HIGH SENSITIVITY)   ____________________________________________  EKG   EKG Interpretation  Date/Time:  Friday June 15 2022 18:51:20 EDT Ventricular Rate:  71 PR Interval:  117 QRS Duration: 93 QT Interval:  403 QTC Calculation: 438 R Axis:   54 Text Interpretation: Sinus rhythm Borderline short PR interval Probable anterior infarct, old Confirmed by Alona Bene [phone]) on 06/15/2022 6:59:50 PM        ____________________________________________  RADIOLOGY  No results found.  ____________________________________________   PROCEDURES  Procedure(s) performed:   Procedures   ____________________________________________   INITIAL IMPRESSION / ASSESSMENT AND PLAN / ED COURSE  Pertinent labs &  imaging results that were available during my care of the patient were reviewed by me and considered in my medical decision making (see chart for details).   This patient is Presenting for Evaluation of abdominal pain, which does require a range of treatment options, and is a  complaint that involves a high risk of morbidity and mortality.  The Differential Diagnoses includes but is not exclusive to acute appendicitis, renal colic, testicular torsion, urinary tract infection, prostatitis,  diverticulitis, small bowel obstruction, colitis, abdominal aortic aneurysm, gastroenteritis, constipation etc.   Critical Interventions-    Medications  sodium chloride 0.9 % bolus 500 mL (has no administration in time range)  ondansetron (ZOFRAN) injection 4 mg (has no administration in time range)    Reassessment after intervention: symptoms improved.    I did obtain Additional Historical Information from family at bedside.   Clinical Laboratory Tests Ordered, included CBC with leukocytosis to 16.4.  No acute kidney injury, LFT abnormality, abnormal lipase. UA negative.   Radiologic Tests Ordered, included CT abdomen/pelvis. I independently interpreted the images and agree with radiology interpretation.   Cardiac Monitor Tracing which shows NSR.    Social Determinants of Health Risk patient is a non-smoker.   Consult complete with  Medical Decision Making: Summary:  Patient presents emergency department abdominal pain.  Mild tenderness on exam.  Plan for CT abdomen pelvis to specifically evaluate for possible diverticulitis, colitis, ileus.  Lower suspicion for ureteral stone or vascular etiology although considered.  Also considering atypical ACS but EKG is reassuring.  Will obtain troponin.   Reevaluation with update and discussion with   ***Considered admission***  Patient's presentation is most consistent with acute presentation with potential threat to life or bodily function.   Disposition:   ____________________________________________  FINAL CLINICAL IMPRESSION(S) / ED DIAGNOSES  Final diagnoses:  None     NEW OUTPATIENT MEDICATIONS STARTED DURING THIS VISIT:  New Prescriptions   No medications on file    Note:  This document was prepared  using Dragon voice recognition software and may include unintentional dictation errors.  Alona Bene, MD, [facility] Emergency Medicine

## 2022-06-15 NOTE — Discharge Instructions (Signed)
We believe your symptoms are caused by diverticulitis.  Most of the time this condition (please read through the included information) can be cured with outpatient antibiotics.  Please take the full course of prescribed medication(s) and follow up with the doctors recommended above.  Call your gastroenterology team to make them aware of your diagnosis and schedule a follow up appointment.

## 2022-06-15 NOTE — ED Triage Notes (Signed)
Lower quadrant pain central. Started yesterday immediately after eating sardine and tomato sandwich. BM today. Some red color in stool. Denies CP, denies SOB.

## 2022-06-15 NOTE — ED Notes (Signed)
Reviewed AVS with patient, patient expressed understanding of directions, denies further questions at this time. 

## 2022-06-15 NOTE — ED Notes (Signed)
Patient to CT via w/c

## 2022-06-16 LAB — ABO/RH: ABO/RH(D): A POS

## 2022-06-20 ENCOUNTER — Telehealth: Payer: Self-pay | Admitting: *Deleted

## 2022-06-20 DIAGNOSIS — K5792 Diverticulitis of intestine, part unspecified, without perforation or abscess without bleeding: Secondary | ICD-10-CM | POA: Diagnosis not present

## 2022-06-20 DIAGNOSIS — I700 Atherosclerosis of aorta: Secondary | ICD-10-CM | POA: Diagnosis not present

## 2022-06-20 NOTE — Telephone Encounter (Signed)
Transition Care Management Unsuccessful Follow-up Telephone Call  Date of discharge and from where:  [facility] ed 4/26  Attempts:  1st Attempt  Reason for unsuccessful TCM follow-up call:  No answer/busy

## 2022-06-21 ENCOUNTER — Telehealth: Payer: Self-pay

## 2022-06-21 NOTE — Telephone Encounter (Signed)
Transition Care Management Follow-up Telephone Call Date of discharge and from where: [facility]  How have you been since you were released from the hospital? Good Any questions or concerns? No  Items Reviewed: Did the pt receive and understand the discharge instructions provided? Yes  Medications obtained and verified? Yes  Other? No  Any new allergies since your discharge? No  Dietary orders reviewed? No Do you have support at home? Yes    Follow up appointments reviewed:  PCP Hospital f/u appt confirmed? Yes  Scheduled to see  on  @ . Specialist Hospital f/u appt confirmed? No  Scheduled to see  on  @ . Are transportation arrangements needed? No  If their condition worsens, is the pt aware to call PCP or go to the Emergency Dept.? Y Was the patient provided with contact information for the PCP's office or ED? Yes Was to pt encouraged to call back with questions or concerns? Yes

## 2022-07-03 ENCOUNTER — Telehealth: Payer: Self-pay | Admitting: Gastroenterology

## 2022-07-03 NOTE — Telephone Encounter (Signed)
Reviewed 06/15/2022 ED with the patient per his request.  Acute diverticulitis versus acute mesenteric ischemic colitis.  I favor the latter based on the abrupt presentation of his symptoms associated with bleeding.  He has had a full recovery and feels well.  He has no ongoing gastrointestinal complaints. Advised a high-fiber diet and very close attention to adequate hydration long-term.  Reviewed diverticulitis and ischemic colitis with the patient.  Addressed his questions to his satisfaction.  He had a colonoscopy performed in April 2021 and I do not feel it needs to be repeated at this time.  If he has GI symptoms he is advised to contact us or his PCP.

## 2022-11-24 DIAGNOSIS — Z23 Encounter for immunization: Secondary | ICD-10-CM | POA: Diagnosis not present

## 2022-11-29 DIAGNOSIS — L820 Inflamed seborrheic keratosis: Secondary | ICD-10-CM | POA: Diagnosis not present

## 2022-11-29 DIAGNOSIS — L821 Other seborrheic keratosis: Secondary | ICD-10-CM | POA: Diagnosis not present

## 2023-01-28 DIAGNOSIS — E785 Hyperlipidemia, unspecified: Secondary | ICD-10-CM | POA: Diagnosis not present

## 2023-02-04 DIAGNOSIS — R030 Elevated blood-pressure reading, without diagnosis of hypertension: Secondary | ICD-10-CM | POA: Diagnosis not present

## 2023-02-04 DIAGNOSIS — Z8719 Personal history of other diseases of the digestive system: Secondary | ICD-10-CM | POA: Diagnosis not present

## 2023-02-04 DIAGNOSIS — M25561 Pain in right knee: Secondary | ICD-10-CM | POA: Diagnosis not present

## 2023-02-04 DIAGNOSIS — E785 Hyperlipidemia, unspecified: Secondary | ICD-10-CM | POA: Diagnosis not present

## 2023-02-04 DIAGNOSIS — I7300 Raynaud's syndrome without gangrene: Secondary | ICD-10-CM | POA: Diagnosis not present

## 2023-02-04 DIAGNOSIS — Z0000 Encounter for general adult medical examination without abnormal findings: Secondary | ICD-10-CM | POA: Diagnosis not present

## 2023-02-04 DIAGNOSIS — Z23 Encounter for immunization: Secondary | ICD-10-CM | POA: Diagnosis not present

## 2023-02-04 DIAGNOSIS — R82998 Other abnormal findings in urine: Secondary | ICD-10-CM | POA: Diagnosis not present

## 2023-02-04 DIAGNOSIS — Z1331 Encounter for screening for depression: Secondary | ICD-10-CM | POA: Diagnosis not present

## 2023-02-04 DIAGNOSIS — Z8546 Personal history of malignant neoplasm of prostate: Secondary | ICD-10-CM | POA: Diagnosis not present

## 2023-02-04 DIAGNOSIS — Z1339 Encounter for screening examination for other mental health and behavioral disorders: Secondary | ICD-10-CM | POA: Diagnosis not present

## 2023-02-04 DIAGNOSIS — I700 Atherosclerosis of aorta: Secondary | ICD-10-CM | POA: Diagnosis not present

## 2023-03-01 DIAGNOSIS — M1711 Unilateral primary osteoarthritis, right knee: Secondary | ICD-10-CM | POA: Diagnosis not present

## 2023-03-21 DIAGNOSIS — M7130 Other bursal cyst, unspecified site: Secondary | ICD-10-CM | POA: Diagnosis not present

## 2023-03-21 DIAGNOSIS — L814 Other melanin hyperpigmentation: Secondary | ICD-10-CM | POA: Diagnosis not present

## 2023-03-21 DIAGNOSIS — Z85828 Personal history of other malignant neoplasm of skin: Secondary | ICD-10-CM | POA: Diagnosis not present

## 2023-03-21 DIAGNOSIS — L821 Other seborrheic keratosis: Secondary | ICD-10-CM | POA: Diagnosis not present

## 2023-03-21 DIAGNOSIS — D225 Melanocytic nevi of trunk: Secondary | ICD-10-CM | POA: Diagnosis not present

## 2023-05-27 NOTE — Progress Notes (Signed)
 COVID Vaccine received:  []  No [x]  Yes Date of any COVID positive Test in last 90 days: no PCP - Martha Clan MD Cardiologist - n/a  Chest x-ray -  EKG -   Stress Test -  ECHO -  Cardiac Cath -   Bowel Prep - [x]  No  []   Yes ______  Pacemaker / ICD device [x]  No []  Yes   Spinal Cord Stimulator:[x]  No []  Yes       History of Sleep Apnea? [x]  No []  Yes   CPAP used?- [x]  No []  Yes    Does the patient monitor blood sugar?          [x]  No []  Yes  []  N/A  Patient has: [x]  NO Hx DM   []  Pre-DM                 []  DM1  []   DM2 Does patient have a [employer] or Dexacom? []  No []  Yes   Fasting Blood Sugar Ranges-  Checks Blood Sugar _____ times a day  GLP1 agonist / usual dose - no GLP1 instructions:  SGLT-2 inhibitors / usual dose - no SGLT-2 instructions:   Blood Thinner / Instructions:no Aspirin Instructions:no  Comments:   Activity level: Patient is able  to climb a flight of stairs without difficulty; [x]  No CP  []  No SOB,  __   Patient can perform ADLs without assistance.   Anesthesia review:   Patient denies shortness of breath, fever, cough and chest pain at PAT appointment.  Patient verbalized understanding and agreement to the Pre-Surgical Instructions that were given to them at this PAT appointment. Patient was also educated of the need to review these PAT instructions again prior to his/her surgery.I reviewed the appropriate phone numbers to call if they have any and questions or concerns.

## 2023-05-27 NOTE — Patient Instructions (Signed)
 SURGICAL WAITING ROOM VISITATION  Patients having surgery or a procedure may have no more than 2 support people in the waiting area - these visitors may rotate.    Children under the age of 76 must have an adult with them who is not the patient.  Due to an increase in RSV and influenza rates and associated hospitalizations, children ages 87 and under may not visit patients in [facility] hospitals.  Visitors with respiratory illnesses are discouraged from visiting and should remain at home.  If the patient needs to stay at the hospital during part of their recovery, the visitor guidelines for inpatient rooms apply. Pre-op nurse will coordinate an appropriate time for 1 support person to accompany patient in pre-op.  This support person may not rotate.    Please refer to the [facility] website for the visitor guidelines for Inpatients (after your surgery is over and you are in a regular room).       Your procedure is scheduled on: 06/11/23   Report to [facility] Main Entrance    Report to admitting at 5:15 AM   Call this number if you have problems the morning of surgery [phone]   Do not eat food :After Midnight.   After Midnight you may have the following liquids until 4:15 AM DAY OF SURGERY  Water Non-Citrus Juices (without pulp, NO RED-Apple, White grape, White cranberry) Black Coffee (NO MILK/CREAM OR CREAMERS, sugar ok)  Clear Tea (NO MILK/CREAM OR CREAMERS, sugar ok) regular and decaf                             Plain Jell-O (NO RED)                                           Fruit ices (not with fruit pulp, NO RED)                                     Popsicles (NO RED)                                                               Sports drinks like Gatorade (NO RED)                  The day of surgery:  Drink ONE (1) Pre-Surgery Clear Ensure 4:15 AM the morning of surgery. Drink in one sitting. Do not sip.  This drink was given to you during your hospital   pre-op appointment visit. Nothing else to drink after completing the  Pre-Surgery Clear Ensure .     Oral Hygiene is also important to reduce your risk of infection.                                    Remember - BRUSH YOUR TEETH THE MORNING OF SURGERY WITH YOUR REGULAR TOOTHPASTE  DENTURES WILL BE REMOVED PRIOR TO SURGERY PLEASE DO NOT APPLY "Poly grip" OR ADHESIVES!!!   Stop all  vitamins and herbal supplements 7 days before surgery.   Take these medicines the morning of surgery with A SIP OF WATER: lipitor(Atorvastatin)             You may not have any metal on your body including hair pins, jewelry, and body piercing             Do not wear make-up, lotions, powders, perfumes/cologne, or deodorant              Men may shave face and neck.   Do not bring valuables to the hospital. [facility] IS NOT             RESPONSIBLE   FOR VALUABLES.   Contacts, glasses, dentures or bridgework may not be worn into surgery.   Bring small overnight bag day of surgery.   DO NOT BRING YOUR HOME MEDICATIONS TO THE HOSPITAL. PHARMACY WILL DISPENSE MEDICATIONS LISTED ON YOUR MEDICATION LIST TO YOU DURING YOUR ADMISSION IN THE HOSPITAL!    Patients discharged on the day of surgery will not be allowed to drive home.  Someone NEEDS to stay with you for the first 24 hours after anesthesia.   Special Instructions: Bring a copy of your healthcare power of attorney and living will documents the day of surgery if you haven't scanned them before.              Please read over the following fact sheets you were given: IF YOU HAVE QUESTIONS ABOUT YOUR PRE-OP INSTRUCTIONS PLEASE CALL [phone] [facility]   If you received a COVID test during your pre-op visit  it is requested that you wear a mask when out in public, stay away from anyone that may not be feeling well and notify your surgeon if you develop symptoms. If you test positive for Covid or have been in contact with anyone that has tested positive in  the last 10 days please notify you surgeon.      Pre-operative 5 CHG Bath Instructions   You can play a key role in reducing the risk of infection after surgery. Your skin needs to be as free of germs as possible. You can reduce the number of germs on your skin by washing with CHG (chlorhexidine gluconate) soap before surgery. CHG is an antiseptic soap that kills germs and continues to kill germs even after washing.   DO NOT use if you have an allergy to chlorhexidine/CHG or antibacterial soaps. If your skin becomes reddened or irritated, stop using the CHG and notify one of our RNs at [phone].   Please shower with the CHG soap starting 4 days before surgery using the following schedule:     Please keep in mind the following:  DO NOT shave, including legs and underarms, starting the day of your first shower.   You may shave your face at any point before/day of surgery.  Place clean sheets on your bed the day you start using CHG soap. Use a clean washcloth (not used since being washed) for each shower. DO NOT sleep with pets once you start using the CHG.   CHG Shower Instructions:  If you choose to wash your hair and private area, wash first with your normal shampoo/soap.  After you use shampoo/soap, rinse your hair and body thoroughly to remove shampoo/soap residue.  Turn the water OFF and apply about 3 tablespoons (45 ml) of CHG soap to a CLEAN washcloth.  Apply CHG soap ONLY FROM YOUR NECK DOWN TO YOUR TOES (  washing for 3-5 minutes)  DO NOT use CHG soap on face, private areas, open wounds, or sores.  Pay special attention to the area where your surgery is being performed.  If you are having back surgery, having someone wash your back for you may be helpful. Wait 2 minutes after CHG soap is applied, then you may rinse off the CHG soap.  Pat dry with a clean towel  Put on clean clothes/pajamas   If you choose to wear lotion, please use ONLY the CHG-compatible lotions on the back  of this paper.     Additional instructions for the day of surgery: DO NOT APPLY any lotions, deodorants, cologne, or perfumes.   Put on clean/comfortable clothes.  Brush your teeth.  Ask your nurse before applying any prescription medications to the skin.      CHG Compatible Lotions   Aveeno Moisturizing lotion  Cetaphil Moisturizing Cream  Cetaphil Moisturizing Lotion  Clairol Herbal Essence Moisturizing Lotion, Dry Skin  Clairol Herbal Essence Moisturizing Lotion, Extra Dry Skin  Clairol Herbal Essence Moisturizing Lotion, Normal Skin  Curel Age Defying Therapeutic Moisturizing Lotion with Alpha Hydroxy  Curel Extreme Care Body Lotion  Curel Soothing Hands Moisturizing Hand Lotion  Curel Therapeutic Moisturizing Cream, Fragrance-Free  Curel Therapeutic Moisturizing Lotion, Fragrance-Free  Curel Therapeutic Moisturizing Lotion, Original Formula  Eucerin Daily Replenishing Lotion  Eucerin Dry Skin Therapy Plus Alpha Hydroxy Crme  Eucerin Dry Skin Therapy Plus Alpha Hydroxy Lotion  Eucerin Original Crme  Eucerin Original Lotion  Eucerin Plus Crme Eucerin Plus Lotion  Eucerin TriLipid Replenishing Lotion  Keri Anti-Bacterial Hand Lotion  Keri Deep Conditioning Original Lotion Dry Skin Formula Softly Scented  Keri Deep Conditioning Original Lotion, Fragrance Free Sensitive Skin Formula  Keri Lotion Fast Absorbing Fragrance Free Sensitive Skin Formula  Keri Lotion Fast Absorbing Softly Scented Dry Skin Formula  Keri Original Lotion  Keri Skin Renewal Lotion Keri Silky Smooth Lotion  Keri Silky Smooth Sensitive Skin Lotion  Nivea Body Creamy Conditioning Oil  Nivea Body Extra Enriched Lotion  Nivea Body Original Lotion  Nivea Body Sheer Moisturizing Lotion Nivea Crme  Nivea Skin Firming Lotion  NutraDerm 30 Skin Lotion  NutraDerm Skin Lotion  NutraDerm Therapeutic Skin Cream  NutraDerm Therapeutic Skin Lotion  ProShield Protective Hand Cream   Incentive  Spirometer  An incentive spirometer is a tool that can help keep your lungs clear and active. This tool measures how well you are filling your lungs with each breath. Taking long deep breaths may help reverse or decrease the chance of developing breathing (pulmonary) problems (especially infection) following: A long period of time when you are unable to move or be active. BEFORE THE PROCEDURE  If the spirometer includes an indicator to show your best effort, your nurse or respiratory therapist will set it to a desired goal. If possible, sit up straight or lean slightly forward. Try not to slouch. Hold the incentive spirometer in an upright position. INSTRUCTIONS FOR USE  Sit on the edge of your bed if possible, or sit up as far as you can in bed or on a chair. Hold the incentive spirometer in an upright position. Breathe out normally. Place the mouthpiece in your mouth and seal your lips tightly around it. Breathe in slowly and as deeply as possible, raising the piston or the ball toward the top of the column. Hold your breath for 3-5 seconds or for as long as possible. Allow the piston or ball to fall to the bottom of  the column. Remove the mouthpiece from your mouth and breathe out normally. Rest for a few seconds and repeat Steps 1 through 7 at least 10 times every 1-2 hours when you are awake. Take your time and take a few normal breaths between deep breaths. The spirometer may include an indicator to show your best effort. Use the indicator as a goal to work toward during each repetition. After each set of 10 deep breaths, practice coughing to be sure your lungs are clear. If you have an incision (the cut made at the time of surgery), support your incision when coughing by placing a pillow or rolled up towels firmly against it. Once you are able to get out of bed, walk around indoors and cough well. You may stop using the incentive spirometer when instructed by your caregiver.  RISKS AND  COMPLICATIONS Take your time so you do not get dizzy or light-headed. If you are in pain, you may need to take or ask for pain medication before doing incentive spirometry. It is harder to take a deep breath if you are having pain. AFTER USE Rest and breathe slowly and easily. It can be helpful to keep track of a log of your progress. Your caregiver can provide you with a simple table to help with this. If you are using the spirometer at home, follow these instructions: SEEK MEDICAL CARE IF:  You are having difficultly using the spirometer. You have trouble using the spirometer as often as instructed. Your pain medication is not giving enough relief while using the spirometer. You develop fever of 100.5 F (38.1 C) or higher. SEEK IMMEDIATE MEDICAL CARE IF:  You cough up bloody sputum that had not been present before. You develop fever of 102 F (38.9 C) or greater. You develop worsening pain at or near the incision site. MAKE SURE YOU:  Understand these instructions. Will watch your condition. Will get help right away if you are not doing well or get worse. Document Released: 06/18/2006 Document Revised: 04/30/2011 Document Reviewed: 08/19/2006 [facility] Patient Information 2014 [place], [place].

## 2023-05-28 ENCOUNTER — Other Ambulatory Visit: Payer: Self-pay

## 2023-05-28 ENCOUNTER — Encounter (HOSPITAL_COMMUNITY): Payer: Self-pay

## 2023-05-28 ENCOUNTER — Encounter (HOSPITAL_COMMUNITY)
Admission: RE | Admit: 2023-05-28 | Discharge: 2023-05-28 | Disposition: A | Discharge status: Home / Self Care | Source: Ambulatory Visit | Attending: Orthopedic Surgery | Admitting: Orthopedic Surgery

## 2023-05-28 VITALS — BP 148/98 | HR 70 | Temp 98.5°F | Resp 16 | Ht 70.0 in | Wt 165.0 lb

## 2023-05-28 DIAGNOSIS — Z01812 Encounter for preprocedural laboratory examination: Secondary | ICD-10-CM | POA: Insufficient documentation

## 2023-05-28 DIAGNOSIS — Z79899 Other long term (current) drug therapy: Secondary | ICD-10-CM | POA: Insufficient documentation

## 2023-05-28 DIAGNOSIS — Z01818 Encounter for other preprocedural examination: Secondary | ICD-10-CM

## 2023-05-28 HISTORY — DX: Unspecified osteoarthritis, unspecified site: M19.90

## 2023-05-28 LAB — CBC
HCT: 46.2 % (ref 39.0–52.0)
Hemoglobin: 15.2 g/dL (ref 13.0–17.0)
MCH: 31.4 pg (ref 26.0–34.0)
MCHC: 32.9 g/dL (ref 30.0–36.0)
MCV: 95.5 fL (ref 80.0–100.0)
Platelets: 249 10*3/uL (ref 150–400)
RBC: 4.84 MIL/uL (ref 4.22–5.81)
RDW: 12.8 % (ref 11.5–15.5)
WBC: 6.1 10*3/uL (ref 4.0–10.5)
nRBC: 0 % (ref 0.0–0.2)

## 2023-05-28 LAB — SURGICAL PCR SCREEN
MRSA, PCR: NEGATIVE
Staphylococcus aureus: NEGATIVE

## 2023-06-09 NOTE — H&P (Signed)
 PARTIAL KNEE ADMISSION H&P  Patient is being admitted for right partial knee arthroplasty.  Therapy Plans: outpatient therapy at [facility] Disposition: Home with wife Planned DVT Prophylaxis: aspirin  81mg  BID DME needed: none PCP: Dr. Bernetta Brilliant - clearance received TXA: IV Allergies: NKDA Anesthesia Concerns: none BMI: 24 Last HgbA1c: Not diabetic   Other: - May want to be SDD - will discuss with wife - No hx of VTE - Hx of pancreatic cancer s/p surgical excision - 3 years ago - oxycodone , robaxin , tylenol , celebrex    Subjective:  Chief Complaint:right knee pain.  HPI: Ermin TRENNON TORBECK, 73 y.o. male, has a history of pain and functional disability in the right knee due to arthritis and has failed non-surgical conservative treatments for greater than 12 weeks to includeNSAID's and/or analgesics, corticosteriod injections, and activity modification.  Onset of symptoms was gradual, starting 2 years ago with gradually worsening course since that time. The patient noted no past surgery on the right knee(s).  Patient currently rates pain in the right knee(s) at 8 out of 10 with activity. Patient has worsening of pain with activity and weight bearing and pain that interferes with activities of daily living.  Patient has evidence of joint space narrowing by imaging studies. There is no active infection.  Patient Active Problem List   Diagnosis Date Noted   Prostate cancer (HCC) 08/03/2019   Malignant neoplasm of prostate (HCC) 06/12/2019   CHEST PAIN, EXERTIONAL 03/16/2009   COUGH 09/26/2006   Past Medical History:  Diagnosis Date   Arthritis    Cataract    surgery to remove   GERD (gastroesophageal reflux disease)    Hyperlipidemia    Prostate cancer (HCC)    Skin cancer    UTI (urinary tract infection)     Past Surgical History:  Procedure Laterality Date   cataracts Bilateral    eye surgery   COLONOSCOPY  2010   mild tics, no polyps   EYE SURGERY     JOINT REPLACEMENT     left hand  surgery     ring finger - repaired tendon   LYMPHADENECTOMY Bilateral 08/03/2019   Procedure: LYMPHADENECTOMY, PELVIC;  Surgeon: Florencio Hunting, MD;  Location: [facility];  Service: Urology;  Laterality: Bilateral;   PROSTATE BIOPSY  04/30/2019   cancerous   REPLACEMENT TOTAL KNEE Left    ROBOT ASSISTED LAPAROSCOPIC RADICAL PROSTATECTOMY N/A 08/03/2019   Procedure: XI ROBOTIC ASSISTED LAPAROSCOPIC RADICAL PROSTATECTOMY LEVEL 2; FLEXIBLE CYSTOSCOPY;  Surgeon: Florencio Hunting, MD;  Location: [facility];  Service: Urology;  Laterality: N/A;    No current facility-administered medications for this encounter.   Current Outpatient Medications  Medication Sig Dispense Refill Last Dose/Taking   atorvastatin  (LIPITOR) 20 MG tablet Take 20 mg by mouth at bedtime.   Taking   celecoxib  (CELEBREX ) 200 MG capsule Take 200 mg by mouth 2 (two) times daily.      ondansetron  (ZOFRAN -ODT) 4 MG disintegrating tablet Take 4 mg by mouth every 8 (eight) hours as needed for nausea or vomiting.      oxyCODONE  (OXY IR/ROXICODONE ) 5 MG immediate release tablet Take 5 mg by mouth every 4 (four) hours as needed (pain.).      Allergies  Allergen Reactions   Penicillins Other (See Comments)    Unknown-childhood allergy, does not know what happens with supposed allergy.    Social History   Tobacco Use   Smoking status: Former    Current packs/day: 0.00    Average packs/day: 0.5 packs/day for 10.0  years (5.0 ttl pk-yrs)    Types: Cigarettes    Start date: 7    Quit date: 24    Years since quitting: 32.3   Smokeless tobacco: Never  Substance Use Topics   Alcohol  use: Not Currently    Comment: wine 14 per week    Family History  Problem Relation Age of Onset   Colon polyps Father    Prostate cancer Father    Skin cancer Mother    Colon cancer Neg Hx    Rectal cancer Neg Hx    Stomach cancer Neg Hx    Breast cancer Neg Hx    Esophageal cancer Neg Hx      Review of Systems  Constitutional:  Negative for  chills and fever.  Respiratory:  Negative for cough and shortness of breath.   Cardiovascular:  Negative for chest pain.  Gastrointestinal:  Negative for nausea and vomiting.  Musculoskeletal:  Positive for arthralgias.     Objective:  Physical Exam Well nourished and well developed. General: Alert and oriented x3, cooperative and pleasant, no acute distress.  Musculoskeletal: Right knee exam: No palpable effusion, warmth erythema No significant flexion contracture with flexion to 120 degrees Tenderness medially Stable medial and lateral collateral ligaments  Left knee exam: Well-healed surgical incision Full knee extension and flexion  Vital signs in last 24 hours:    Labs:   Estimated body mass index is 23.68 kg/m as calculated from the following:   Height as of 05/28/23: 5\' 10"  (1.778 m).   Weight as of 05/28/23: 74.8 kg.   Imaging Review Plain radiographs demonstrate severe medial compartment degenerative joint disease of the right knee(s). The bone quality appears to be adequate for age and reported activity level.      Assessment/Plan:  End stage arthritis, right knee   The patient history, physical examination, clinical judgment of the provider and imaging studies are consistent with end stage degenerative joint disease of the right knee(s) and partial knee arthroplasty is deemed medically necessary. The treatment options including medical management, injection therapy arthroscopy and arthroplasty were discussed at length. The risks and benefits of partial knee arthroplasty were presented and reviewed. The risks due to aseptic loosening, infection, stiffness, patella tracking problems, thromboembolic complications and other imponderables were discussed. The patient acknowledged the explanation, agreed to proceed with the plan and consent was signed. Patient is being admitted for inpatient treatment for surgery, pain control, PT, OT, prophylactic antibiotics, VTE  prophylaxis, progressive ambulation and ADL's and discharge planning. The patient is planning to be discharged  home.    Kim Pen, PA-C Orthopedic Surgery [facility] [place] [phone]

## 2023-06-10 NOTE — Anesthesia Preprocedure Evaluation (Addendum)
 Anesthesia Evaluation  Patient identified by MRN, date of birth, ID band Patient awake    Reviewed: Allergy & Precautions, NPO status , Patient's Chart, lab work & pertinent test results  Airway Mallampati: II  TM Distance: >3 FB Neck ROM: Full    Dental  (+) Teeth Intact, Dental Advisory Given   Pulmonary former smoker   Pulmonary exam normal breath sounds clear to auscultation       Cardiovascular negative cardio ROS Normal cardiovascular exam Rhythm:Regular Rate:Normal     Neuro/Psych negative neurological ROS  negative psych ROS   GI/Hepatic Neg liver ROS,GERD  ,,  Endo/Other  negative endocrine ROS    Renal/GU negative Renal ROS   Prostate cancer     Musculoskeletal  (+) Arthritis ,  Right Medial Compartmental osteoarthritis   Abdominal   Peds  Hematology negative hematology ROS (+) Plt 249k   Anesthesia Other Findings   Reproductive/Obstetrics                              Anesthesia Physical Anesthesia Plan  ASA: 2  Anesthesia Plan: Spinal   Post-op Pain Management: Regional block* and Tylenol  PO (pre-op)*   Induction: Intravenous  PONV Risk Score and Plan: 1 and TIVA, Dexamethasone  and Ondansetron   Airway Management Planned: Natural Airway and Simple Face Mask  Additional Equipment:   Intra-op Plan:   Post-operative Plan:   Informed Consent: I have reviewed the patients History and Physical, chart, labs and discussed the procedure including the risks, benefits and alternatives for the proposed anesthesia with the patient or authorized representative who has indicated his/her understanding and acceptance.     Dental advisory given  Plan Discussed with: CRNA  Anesthesia Plan Comments:         Anesthesia Quick Evaluation

## 2023-06-11 ENCOUNTER — Observation Stay (HOSPITAL_COMMUNITY)
Admission: RE | Admit: 2023-06-11 | Discharge: 2023-06-12 | Disposition: A | Discharge status: Home / Self Care | Payer: Medicare Other | Source: Ambulatory Visit | Attending: Orthopedic Surgery | Admitting: Orthopedic Surgery

## 2023-06-11 ENCOUNTER — Encounter (HOSPITAL_COMMUNITY): Payer: Self-pay | Admitting: Orthopedic Surgery

## 2023-06-11 ENCOUNTER — Other Ambulatory Visit: Payer: Self-pay

## 2023-06-11 ENCOUNTER — Ambulatory Visit (HOSPITAL_COMMUNITY): Admitting: Anesthesiology

## 2023-06-11 ENCOUNTER — Encounter (HOSPITAL_COMMUNITY)
Admission: RE | Disposition: A | Discharge status: Home / Self Care | Payer: Self-pay | Source: Ambulatory Visit | Attending: Orthopedic Surgery

## 2023-06-11 DIAGNOSIS — G8918 Other acute postprocedural pain: Secondary | ICD-10-CM | POA: Diagnosis not present

## 2023-06-11 DIAGNOSIS — Z87891 Personal history of nicotine dependence: Secondary | ICD-10-CM | POA: Insufficient documentation

## 2023-06-11 DIAGNOSIS — Z85828 Personal history of other malignant neoplasm of skin: Secondary | ICD-10-CM | POA: Diagnosis not present

## 2023-06-11 DIAGNOSIS — Z96652 Presence of left artificial knee joint: Secondary | ICD-10-CM | POA: Insufficient documentation

## 2023-06-11 DIAGNOSIS — Z96651 Presence of right artificial knee joint: Principal | ICD-10-CM

## 2023-06-11 DIAGNOSIS — Z8546 Personal history of malignant neoplasm of prostate: Secondary | ICD-10-CM | POA: Diagnosis not present

## 2023-06-11 DIAGNOSIS — Z79899 Other long term (current) drug therapy: Secondary | ICD-10-CM | POA: Insufficient documentation

## 2023-06-11 DIAGNOSIS — M1711 Unilateral primary osteoarthritis, right knee: Secondary | ICD-10-CM | POA: Diagnosis not present

## 2023-06-11 HISTORY — PX: PARTIAL KNEE ARTHROPLASTY: SHX2174

## 2023-06-11 LAB — GLUCOSE, CAPILLARY: Glucose-Capillary: 186 mg/dL — ABNORMAL HIGH (ref 70–99)

## 2023-06-11 SURGERY — ARTHROPLASTY, KNEE, UNICOMPARTMENTAL
Anesthesia: Spinal | Site: Knee | Laterality: Right

## 2023-06-11 MED ORDER — HYDROMORPHONE HCL 1 MG/ML IJ SOLN
0.5000 mg | INTRAMUSCULAR | Status: DC | PRN
Start: 2023-06-11 — End: 2023-06-12

## 2023-06-11 MED ORDER — ONDANSETRON HCL 4 MG PO TABS: 4.0000 mg | ORAL_TABLET | Freq: Four times a day (QID) | ORAL | Status: DC | PRN

## 2023-06-11 MED ORDER — METOCLOPRAMIDE HCL 5 MG PO TABS: 5.0000 mg | ORAL_TABLET | Freq: Three times a day (TID) | ORAL | Status: DC | PRN

## 2023-06-11 MED ORDER — BISACODYL 10 MG RE SUPP: 10.0000 mg | Freq: Every day | RECTAL | Status: DC | PRN

## 2023-06-11 MED ORDER — DEXAMETHASONE SODIUM PHOSPHATE 10 MG/ML IJ SOLN
INTRAMUSCULAR | Status: AC
  Filled 2023-06-11: qty 1

## 2023-06-11 MED ORDER — PHENOL 1.4 % MT LIQD: 1.0000 | OROMUCOSAL | Status: DC | PRN

## 2023-06-11 MED ORDER — PROPOFOL 10 MG/ML IV BOLUS
INTRAVENOUS | Status: DC | PRN
  Administered 2023-06-11 (×2): 20 mg via INTRAVENOUS

## 2023-06-11 MED ORDER — METOCLOPRAMIDE HCL 5 MG/ML IJ SOLN: 5.0000 mg | Freq: Three times a day (TID) | INTRAMUSCULAR | Status: DC | PRN

## 2023-06-11 MED ORDER — CHLORHEXIDINE GLUCONATE 0.12 % MT SOLN
15.0000 mL | Freq: Once | OROMUCOSAL | Status: AC
  Administered 2023-06-11: 15 mL via OROMUCOSAL

## 2023-06-11 MED ORDER — DEXAMETHASONE SODIUM PHOSPHATE 10 MG/ML IJ SOLN
10.0000 mg | Freq: Once | INTRAMUSCULAR | Status: AC
  Administered 2023-06-12: 10 mg via INTRAVENOUS
  Filled 2023-06-11: qty 1

## 2023-06-11 MED ORDER — PROPOFOL 500 MG/50ML IV EMUL
INTRAVENOUS | Status: DC | PRN
  Administered 2023-06-11: 75 ug/kg/min via INTRAVENOUS

## 2023-06-11 MED ORDER — OXYCODONE HCL 5 MG PO TABS: 10.0000 mg | ORAL_TABLET | ORAL | Status: DC | PRN

## 2023-06-11 MED ORDER — ALUM & MAG HYDROXIDE-SIMETH 200-200-20 MG/5ML PO SUSP: 30.0000 mL | ORAL | Status: DC | PRN

## 2023-06-11 MED ORDER — ASPIRIN 81 MG PO CHEW
81.0000 mg | CHEWABLE_TABLET | Freq: Two times a day (BID) | ORAL | Status: DC
  Administered 2023-06-11 – 2023-06-12 (×2): 81 mg via ORAL
  Filled 2023-06-11 (×2): qty 1

## 2023-06-11 MED ORDER — KETOROLAC TROMETHAMINE 30 MG/ML IJ SOLN
INTRAMUSCULAR | Status: AC
  Filled 2023-06-11: qty 1

## 2023-06-11 MED ORDER — POLYETHYLENE GLYCOL 3350 17 G PO PACK
17.0000 g | PACK | Freq: Two times a day (BID) | ORAL | Status: DC
  Administered 2023-06-12: 17 g via ORAL
  Filled 2023-06-11 (×2): qty 1

## 2023-06-11 MED ORDER — FENTANYL CITRATE PF 50 MCG/ML IJ SOSY
PREFILLED_SYRINGE | INTRAMUSCULAR | Status: AC
  Administered 2023-06-11: 50 ug via INTRAVENOUS
  Filled 2023-06-11: qty 3

## 2023-06-11 MED ORDER — PHENYLEPHRINE HCL-NACL 20-0.9 MG/250ML-% IV SOLN
INTRAVENOUS | Status: DC | PRN
  Administered 2023-06-11: 30 ug/min via INTRAVENOUS

## 2023-06-11 MED ORDER — DEXAMETHASONE SODIUM PHOSPHATE 10 MG/ML IJ SOLN
8.0000 mg | Freq: Once | INTRAMUSCULAR | Status: AC
  Administered 2023-06-11: 8 mg via INTRAVENOUS

## 2023-06-11 MED ORDER — ORAL CARE MOUTH RINSE: 15.0000 mL | Freq: Once | OROMUCOSAL | Status: AC

## 2023-06-11 MED ORDER — DIPHENHYDRAMINE HCL 12.5 MG/5ML PO ELIX: 12.5000 mg | ORAL_SOLUTION | ORAL | Status: DC | PRN

## 2023-06-11 MED ORDER — CEFAZOLIN SODIUM-DEXTROSE 2-4 GM/100ML-% IV SOLN
2.0000 g | Freq: Four times a day (QID) | INTRAVENOUS | Status: AC
  Administered 2023-06-11 (×2): 2 g via INTRAVENOUS
  Filled 2023-06-11 (×2): qty 100

## 2023-06-11 MED ORDER — ROPIVACAINE HCL 5 MG/ML IJ SOLN
INTRAMUSCULAR | Status: DC | PRN
Start: 2023-06-11 — End: 2023-06-11
  Administered 2023-06-11: 20 mL via PERINEURAL

## 2023-06-11 MED ORDER — MENTHOL 3 MG MT LOZG: 1.0000 | LOZENGE | OROMUCOSAL | Status: DC | PRN

## 2023-06-11 MED ORDER — TRANEXAMIC ACID-NACL 1000-0.7 MG/100ML-% IV SOLN
1000.0000 mg | INTRAVENOUS | Status: AC
  Administered 2023-06-11: 1000 mg via INTRAVENOUS
  Filled 2023-06-11: qty 100

## 2023-06-11 MED ORDER — METHOCARBAMOL 500 MG PO TABS
500.0000 mg | ORAL_TABLET | Freq: Four times a day (QID) | ORAL | Status: DC | PRN
  Administered 2023-06-11 (×2): 500 mg via ORAL
  Filled 2023-06-11 (×2): qty 1

## 2023-06-11 MED ORDER — ONDANSETRON HCL 4 MG/2ML IJ SOLN: 4.0000 mg | Freq: Four times a day (QID) | INTRAMUSCULAR | Status: DC | PRN

## 2023-06-11 MED ORDER — SODIUM CHLORIDE (PF) 0.9 % IJ SOLN
INTRAMUSCULAR | Status: DC | PRN
  Administered 2023-06-11: 30 mL via INTRAVENOUS

## 2023-06-11 MED ORDER — TRANEXAMIC ACID-NACL 1000-0.7 MG/100ML-% IV SOLN
1000.0000 mg | Freq: Once | INTRAVENOUS | Status: AC
Start: 2023-06-11 — End: 2023-06-12
  Administered 2023-06-11: 1000 mg via INTRAVENOUS
  Filled 2023-06-11: qty 100

## 2023-06-11 MED ORDER — CELECOXIB 200 MG PO CAPS
200.0000 mg | ORAL_CAPSULE | Freq: Two times a day (BID) | ORAL | Status: DC
  Administered 2023-06-11 – 2023-06-12 (×2): 200 mg via ORAL
  Filled 2023-06-11 (×2): qty 1

## 2023-06-11 MED ORDER — ATORVASTATIN CALCIUM 20 MG PO TABS: 20.0000 mg | ORAL_TABLET | Freq: Every day | ORAL | Status: DC

## 2023-06-11 MED ORDER — POVIDONE-IODINE 10 % EX SWAB: 2.0000 | Freq: Once | CUTANEOUS | Status: DC

## 2023-06-11 MED ORDER — ONDANSETRON HCL 4 MG/2ML IJ SOLN
INTRAMUSCULAR | Status: DC | PRN
  Administered 2023-06-11: 4 mg via INTRAVENOUS

## 2023-06-11 MED ORDER — SODIUM CHLORIDE 0.9% FLUSH: 3.0000 mL | INTRAVENOUS | Status: DC | PRN

## 2023-06-11 MED ORDER — FENTANYL CITRATE (PF) 100 MCG/2ML IJ SOLN
INTRAMUSCULAR | Status: AC
  Filled 2023-06-11: qty 2

## 2023-06-11 MED ORDER — 0.9 % SODIUM CHLORIDE (POUR BTL) OPTIME
TOPICAL | Status: DC | PRN
  Administered 2023-06-11: 1000 mL

## 2023-06-11 MED ORDER — SODIUM CHLORIDE (PF) 0.9 % IJ SOLN
INTRAMUSCULAR | Status: AC
  Filled 2023-06-11: qty 30

## 2023-06-11 MED ORDER — METHOCARBAMOL 1000 MG/10ML IJ SOLN: 500.0000 mg | Freq: Four times a day (QID) | INTRAMUSCULAR | Status: DC | PRN

## 2023-06-11 MED ORDER — SODIUM CHLORIDE 0.9% FLUSH
3.0000 mL | Freq: Two times a day (BID) | INTRAVENOUS | Status: DC
  Administered 2023-06-12: 10 mL via INTRAVENOUS

## 2023-06-11 MED ORDER — ACETAMINOPHEN 500 MG PO TABS
1000.0000 mg | ORAL_TABLET | Freq: Once | ORAL | Status: AC
  Administered 2023-06-11: 1000 mg via ORAL
  Filled 2023-06-11: qty 2

## 2023-06-11 MED ORDER — FENTANYL CITRATE PF 50 MCG/ML IJ SOSY
25.0000 ug | PREFILLED_SYRINGE | INTRAMUSCULAR | Status: DC | PRN
  Administered 2023-06-11: 50 ug via INTRAVENOUS

## 2023-06-11 MED ORDER — CLONIDINE HCL (ANALGESIA) 100 MCG/ML EP SOLN
EPIDURAL | Status: DC | PRN
  Administered 2023-06-11: 50 ug

## 2023-06-11 MED ORDER — CEFAZOLIN SODIUM-DEXTROSE 2-4 GM/100ML-% IV SOLN
2.0000 g | INTRAVENOUS | Status: AC
  Administered 2023-06-11: 2 g via INTRAVENOUS
  Filled 2023-06-11: qty 100

## 2023-06-11 MED ORDER — BUPIVACAINE-EPINEPHRINE 0.25% -1:200000 IJ SOLN
INTRAMUSCULAR | Status: DC | PRN
  Administered 2023-06-11: 30 mL

## 2023-06-11 MED ORDER — ONDANSETRON HCL 4 MG/2ML IJ SOLN: 4.0000 mg | Freq: Once | INTRAMUSCULAR | Status: DC | PRN

## 2023-06-11 MED ORDER — OXYCODONE HCL 5 MG PO TABS
5.0000 mg | ORAL_TABLET | ORAL | Status: DC | PRN
  Administered 2023-06-11 – 2023-06-12 (×4): 5 mg via ORAL
  Filled 2023-06-11: qty 1
  Filled 2023-06-11 (×2): qty 2
  Filled 2023-06-11: qty 1

## 2023-06-11 MED ORDER — BUPIVACAINE IN DEXTROSE 0.75-8.25 % IT SOLN
INTRATHECAL | Status: DC | PRN
  Administered 2023-06-11: 1.6 mL via INTRATHECAL

## 2023-06-11 MED ORDER — ACETAMINOPHEN 500 MG PO TABS
1000.0000 mg | ORAL_TABLET | Freq: Four times a day (QID) | ORAL | Status: DC
  Administered 2023-06-11 – 2023-06-12 (×4): 1000 mg via ORAL
  Filled 2023-06-11 (×5): qty 2

## 2023-06-11 MED ORDER — STERILE WATER FOR IRRIGATION IR SOLN
Status: DC | PRN
  Administered 2023-06-11: 1000 mL

## 2023-06-11 MED ORDER — ONDANSETRON HCL 4 MG/2ML IJ SOLN
INTRAMUSCULAR | Status: AC
  Filled 2023-06-11: qty 2

## 2023-06-11 MED ORDER — BUPIVACAINE-EPINEPHRINE (PF) 0.25% -1:200000 IJ SOLN
INTRAMUSCULAR | Status: AC
  Filled 2023-06-11: qty 30

## 2023-06-11 MED ORDER — FENTANYL CITRATE (PF) 100 MCG/2ML IJ SOLN
INTRAMUSCULAR | Status: DC | PRN
  Administered 2023-06-11 (×2): 50 ug via INTRAVENOUS

## 2023-06-11 MED ORDER — SENNA 8.6 MG PO TABS
2.0000 | ORAL_TABLET | Freq: Every day | ORAL | Status: DC
  Administered 2023-06-11: 17.2 mg via ORAL
  Filled 2023-06-11: qty 2

## 2023-06-11 MED ORDER — LACTATED RINGERS IV SOLN: INTRAVENOUS | Status: DC

## 2023-06-11 MED ORDER — KETOROLAC TROMETHAMINE 30 MG/ML IJ SOLN
INTRAMUSCULAR | Status: DC | PRN
  Administered 2023-06-11: 30 mg via INTRA_ARTICULAR

## 2023-06-11 SURGICAL SUPPLY — 41 items
BAG COUNTER SPONGE SURGICOUNT (BAG) IMPLANT
BAG ZIPLOCK 12X15 (MISCELLANEOUS) IMPLANT
BLADE SAW RECIPROCATING 77.5 (BLADE) ×1 IMPLANT
BLADE SAW SGTL 13.0X1.19X90.0M (BLADE) ×1 IMPLANT
BNDG ELASTIC 6INX 5YD STR LF (GAUZE/BANDAGES/DRESSINGS) ×1 IMPLANT
BOWL SMART MIX CTS (DISPOSABLE) ×1 IMPLANT
CEMENT BONE R 1X40 (Cement) ×1 IMPLANT
COMPONENT FEM CMT PS KNEE RM (Joint) IMPLANT
COMPONENT PARTIAL KNEE PS E RM (Joint) IMPLANT
COVER SURGICAL LIGHT HANDLE (MISCELLANEOUS) ×1 IMPLANT
CUFF TRNQT CYL 34X4.125X (TOURNIQUET CUFF) ×1 IMPLANT
DERMABOND ADVANCED .7 DNX12 (GAUZE/BANDAGES/DRESSINGS) ×1 IMPLANT
DRAPE U-SHAPE 47X51 STRL (DRAPES) ×1 IMPLANT
DRESSING AQUACEL AG SP 3.5X10 (GAUZE/BANDAGES/DRESSINGS) ×1 IMPLANT
DURAPREP 26ML APPLICATOR (WOUND CARE) ×1 IMPLANT
ELECT REM PT RETURN 15FT ADLT (MISCELLANEOUS) ×1 IMPLANT
GLOVE BIO SURGEON STRL SZ 6 (GLOVE) ×1 IMPLANT
GLOVE BIOGEL PI IND STRL 6.5 (GLOVE) ×1 IMPLANT
GLOVE BIOGEL PI IND STRL 7.5 (GLOVE) ×1 IMPLANT
GLOVE ORTHO TXT STRL SZ7.5 (GLOVE) ×2 IMPLANT
GOWN STRL REUS W/ TWL LRG LVL3 (GOWN DISPOSABLE) ×2 IMPLANT
HOLDER FOLEY CATH W/STRAP (MISCELLANEOUS) IMPLANT
INSERT TIB FB KNEE E 8 RT (Insert) IMPLANT
INSERTER TIP PARTIAL KNEE (MISCELLANEOUS) IMPLANT
KIT TURNOVER KIT A (KITS) IMPLANT
MANIFOLD NEPTUNE II (INSTRUMENTS) ×1 IMPLANT
NDL SAFETY ECLIPSE 18X1.5 (NEEDLE) ×1 IMPLANT
PACK TOTAL KNEE CUSTOM (KITS) ×1 IMPLANT
PENCIL SMOKE EVACUATOR (MISCELLANEOUS) ×1 IMPLANT
PIN DRILL HDLS TROCAR 75 4PK (PIN) IMPLANT
SCREW HEADED 33MM KNEE (MISCELLANEOUS) IMPLANT
SCREW HEADED 48MM KNEE (MISCELLANEOUS) IMPLANT
SET PAD KNEE POSITIONER (MISCELLANEOUS) ×1 IMPLANT
SPIKE FLUID TRANSFER (MISCELLANEOUS) ×2 IMPLANT
SUT MNCRL AB 4-0 PS2 18 (SUTURE) ×1 IMPLANT
SUT STRATAFIX PDS+ 0 24IN (SUTURE) ×1 IMPLANT
SUT VIC AB 1 CT1 36 (SUTURE) ×1 IMPLANT
SUT VIC AB 2-0 CT1 TAPERPNT 27 (SUTURE) ×2 IMPLANT
SYR 3ML LL SCALE MARK (SYRINGE) ×1 IMPLANT
TRAY FOLEY MTR SLVR 16FR STAT (SET/KITS/TRAYS/PACK) ×1 IMPLANT
WRAP KNEE MAXI GEL POST OP (GAUZE/BANDAGES/DRESSINGS) ×1 IMPLANT

## 2023-06-11 NOTE — Anesthesia Postprocedure Evaluation (Signed)
 Anesthesia Post Note  Patient: Oscar Valencia  Procedure(s) Performed: ARTHROPLASTY, KNEE, UNICOMPARTMENTAL (Right: Knee)     Patient location during evaluation: PACU Anesthesia Type: Spinal Level of consciousness: awake, awake and alert and oriented Pain management: pain level controlled Vital Signs Assessment: post-procedure vital signs reviewed and stable Respiratory status: spontaneous breathing, nonlabored ventilation and respiratory function stable Cardiovascular status: blood pressure returned to baseline and stable Postop Assessment: no headache, no backache, spinal receding and no apparent nausea or vomiting Anesthetic complications: no   No notable events documented.  Last Vitals:  Vitals:   06/11/23 0857 06/11/23 0900  BP: 104/76 108/73  Pulse: 67 66  Resp: 11 11  Temp:  36.6 C  SpO2: 94% 94%    Last Pain:  Vitals:   06/11/23 0920  TempSrc:   PainSc: 4                  Erin Havers

## 2023-06-11 NOTE — Anesthesia Procedure Notes (Signed)
 Procedure Name: MAC Date/Time: 06/11/2023 7:20 AM  Performed by: Rochell Chroman, CRNAPre-anesthesia Checklist: Patient identified, Emergency Drugs available, Suction available and Patient being monitored Patient Re-evaluated:Patient Re-evaluated prior to induction Oxygen Delivery Method: Simple face mask

## 2023-06-11 NOTE — Care Plan (Signed)
 Ortho Bundle Case Management Note  Patient Details  Name: BRANDEN SHALLENBERGER MRN: [id number] Date of Birth: 24-Jan-1951  RT UKA on 06/11/23  DCP: Home with wife DME: No needs, has RW PT: [facility]                 DME Arranged:  N/A DME Agency:  NA  HH Arranged:    HH Agency:     Additional Comments: Please contact me with any questions of if this plan should need to change.  Kathlene Paradise, Case Manager [facility] [phone] ext [phone]   06/11/2023, 7:57 AM

## 2023-06-11 NOTE — Transfer of Care (Signed)
 Immediate Anesthesia Transfer of Care Note  Patient: Oscar Valencia  Procedure(s) Performed: ARTHROPLASTY, KNEE, UNICOMPARTMENTAL (Right: Knee)  Patient Location: PACU  Anesthesia Type:Spinal  Level of Consciousness: awake and patient cooperative  Airway & Oxygen Therapy: Patient Spontanous Breathing  Post-op Assessment: Report given to RN and Post -op Vital signs reviewed and stable  Post vital signs: Reviewed and stable  Last Vitals:  Vitals Value Taken Time  BP 104/76 06/11/23 0857  Temp    Pulse 67 06/11/23 0859  Resp 12 06/11/23 0859  SpO2 94 % 06/11/23 0859  Vitals shown include unfiled device data.  Last Pain:  Vitals:   06/11/23 0619  TempSrc:   PainSc: 0-No pain         Complications: No notable events documented.

## 2023-06-11 NOTE — Anesthesia Procedure Notes (Signed)
 Anesthesia Regional Block: Adductor canal block   Pre-Anesthetic Checklist: , timeout performed,  Correct Patient, Correct Site, Correct Laterality,  Correct Procedure, Correct Position, site marked,  Risks and benefits discussed,  Surgical consent,  Pre-op evaluation,  At surgeon's request and post-op pain management  Laterality: Right  Prep: chloraprep       Needles:  Injection technique: Single-shot  Needle Type: Echogenic Needle     Needle Length: 9cm  Needle Gauge: 21     Additional Needles:   Procedures:,,,, ultrasound used (permanent image in chart),,    Narrative:  Start time: 06/11/2023 6:49 AM End time: 06/11/2023 6:56 AM Injection made incrementally with aspirations every 5 mL.  Performed by: Personally  Anesthesiologist: Erin Havers, MD  Additional Notes: No pain on injection. No increased resistance to injection. Injection made in 5cc increments.  Good needle visualization.  Patient tolerated procedure well.

## 2023-06-11 NOTE — Interval H&P Note (Signed)
 History and Physical Interval Note:  06/11/2023 6:56 AM  Oscar Valencia  has presented today for surgery, with the diagnosis of Right Medial Compartmental osteoarthritis.  The various methods of treatment have been discussed with the patient and family. After consideration of risks, benefits and other options for treatment, the patient has consented to  Procedure(s): ARTHROPLASTY, KNEE, UNICOMPARTMENTAL (Right) as a surgical intervention.  The patient's history has been reviewed, patient examined, no change in status, stable for surgery.  I have reviewed the patient's chart and labs.  Questions were answered to the patient's satisfaction.     Bevin Bucks

## 2023-06-11 NOTE — Evaluation (Signed)
 Physical Therapy Evaluation Patient Details Name: Oscar Valencia MRN: [id number] DOB: 03/22/1950 Today's Date: 06/11/2023  History of Present Illness  73 yo male s/p R UKA on 06/11/23. PMH: pancreatic cancer s/p surgical excision - 3 years ago, L UKA, prostate CA  Clinical Impression  Pt is s/p UKA resulting in the deficits listed below (see PT Problem List).  Pt amb 28' with RW and CGA. Anticipate steady progress in acute setting. Pt is motivated to d/c home tomorrow   Pt will benefit from acute skilled PT to increase their independence and safety with mobility to allow discharge.          If plan is discharge home, recommend the following: Assistance with cooking/housework;Assist for transportation   Can travel by private vehicle        Equipment Recommendations None recommended by PT  Recommendations for Other Services       Functional Status Assessment Patient has had a recent decline in their functional status and demonstrates the ability to make significant improvements in function in a reasonable and predictable amount of time.     Precautions / Restrictions Precautions Precautions: Fall;Knee Restrictions Weight Bearing Restrictions Per Provider Order: No Other Position/Activity Restrictions: WBAT      Mobility  Bed Mobility Overal bed mobility: Needs Assistance Bed Mobility: Supine to Sit     Supine to sit: Contact guard, Supervision          Transfers Overall transfer level: Needs assistance Equipment used: Rolling walker (2 wheels) Transfers: Sit to/from Stand Sit to Stand: Contact guard assist, Min assist           General transfer comment: cues for hand placement and RLE position    Ambulation/Gait Ambulation/Gait assistance: Contact guard assist Gait Distance (Feet): 70 Feet Assistive device: Rolling walker (2 wheels) Gait Pattern/deviations: Step-to pattern, Decreased stance time - right       General Gait Details: cues for sequence and RW  position  Stairs            Wheelchair Mobility     Tilt Bed    Modified Rankin (Stroke Patients Only)       Balance                                             Pertinent Vitals/Pain Pain Assessment Pain Assessment: 0-10 Pain Score: 0-No pain Pain Descriptors / Indicators: Tingling Pain Intervention(s): Limited activity within patient's tolerance, Monitored during session, Premedicated before session    Home Living Family/patient expects to be discharged to:: Private residence Living Arrangements: Spouse/significant other Available Help at Discharge: Family;Available 24 hours/day Type of Home: Other(Comment) (townhouse) Home Access: Stairs to enter   [employer] of Steps: 3   Home Layout: Multi-level;Able to live on main level with bedroom/bathroom Home Equipment: Agricultural consultant (2 wheels)      Prior Function Prior Level of Function : Independent/Modified Independent                     Extremity/Trunk Assessment   Upper Extremity Assessment Upper Extremity Assessment: Overall WFL for tasks assessed    Lower Extremity Assessment Lower Extremity Assessment: RLE deficits/detail RLE Deficits / Details: ankle WFL, knee extension and hip flexion 2+/5, limitations d/t post op weakness       Communication   Communication Communication: No apparent difficulties    Cognition Arousal:  Alert Behavior During Therapy: WFL for tasks assessed/performed   PT - Cognitive impairments: No apparent impairments                         Following commands: Intact       Cueing Cueing Techniques: Verbal cues     General Comments      Exercises Total Joint Exercises Ankle Circles/Pumps: AROM, Both, 5 reps Quad Sets: 5 reps, AROM, Both   Assessment/Plan    PT Assessment Patient needs continued PT services  PT Problem List Decreased strength;Decreased range of motion;Decreased balance;Pain;Decreased knowledge of  use of DME       PT Treatment Interventions DME instruction;Gait training;Stair training;Functional mobility training;Therapeutic activities;Therapeutic exercise;Patient/family education    PT Goals (Current goals can be found in the Care Plan section)  Acute Rehab PT Goals PT Goal Formulation: With patient Time For Goal Achievement: 06/18/23 Potential to Achieve Goals: Good    Frequency 7X/week     Co-evaluation               AM-PAC PT "6 Clicks" Mobility  Outcome Measure Help needed turning from your back to your side while in a flat bed without using bedrails?: A Little Help needed moving from lying on your back to sitting on the side of a flat bed without using bedrails?: A Little Help needed moving to and from a bed to a chair (including a wheelchair)?: A Little Help needed standing up from a chair using your arms (e.g., wheelchair or bedside chair)?: A Little Help needed to walk in hospital room?: A Little Help needed climbing 3-5 steps with a railing? : A Little 6 Click Score: 18    End of Session Equipment Utilized During Treatment: Gait belt Activity Tolerance: Patient tolerated treatment well Patient left: with call bell/phone within reach;in chair;with chair alarm set;with family/visitor present   PT Visit Diagnosis: Other abnormalities of gait and mobility (R26.89)    Time: 3818-2993 PT Time Calculation (min) (ACUTE ONLY): 28 min   Charges:   PT Evaluation $PT Eval Low Complexity: 1 Low PT Treatments $Gait Training: 8-22 mins PT General Charges $$ ACUTE PT VISIT: 1 Visit         Alfhild Partch, PT  Acute Rehab Dept ([facility]) [phone]  06/11/2023   [facility] 06/11/2023, 3:28 PM

## 2023-06-11 NOTE — Anesthesia Procedure Notes (Signed)
 Spinal  Patient location during procedure: OR Start time: 06/11/2023 7:16 AM End time: 06/11/2023 7:19 AM Reason for block: surgical anesthesia Staffing Performed: anesthesiologist  Anesthesiologist: Erin Havers, MD Performed by: Erin Havers, MD Authorized by: Erin Havers, MD   Preanesthetic Checklist Completed: patient identified, IV checked, risks and benefits discussed, surgical consent, monitors and equipment checked, pre-op evaluation and timeout performed Spinal Block Patient position: sitting Prep: DuraPrep and site prepped and draped Patient monitoring: continuous pulse ox and blood pressure Approach: midline Location: L3-4 Injection technique: single-shot Needle Needle type: Pencan  Needle gauge: 24 G Assessment Events: CSF return Additional Notes Functioning IV was confirmed and monitors were applied. Sterile prep and drape, including hand hygiene, mask and sterile gloves were used. The patient was positioned and the spine was prepped. The skin was anesthetized with lidocaine .  Free flow of clear CSF was obtained prior to injecting local anesthetic into the CSF.  The spinal needle aspirated freely following injection.  The needle was carefully withdrawn.  The patient tolerated the procedure well. Consent was obtained prior to procedure with all questions answered and concerns addressed. Risks including but not limited to bleeding, infection, nerve damage, paralysis, failed block, inadequate analgesia, allergic reaction, high spinal, itching and headache were discussed and the patient wished to proceed.   Gwenevere Lent, MD

## 2023-06-11 NOTE — Plan of Care (Signed)
  Problem: Education: Goal: Knowledge of General Education information will improve Description: Including pain rating scale, medication(s)/side effects and non-pharmacologic comfort measures Outcome: Progressing   Problem: Activity: Goal: Risk for activity intolerance will decrease Outcome: Progressing   Problem: Safety: Goal: Ability to remain free from injury will improve Outcome: Progressing   Problem: Activity: Goal: Ability to avoid complications of mobility impairment will improve Outcome: Progressing   Problem: Clinical Measurements: Goal: Postoperative complications will be avoided or minimized Outcome: Progressing

## 2023-06-11 NOTE — Brief Op Note (Signed)
 06/11/2023  7:15 AM  PATIENT:  Oscar Valencia  73 y.o. male  PRE-OPERATIVE DIAGNOSIS:  Right Medial Compartmental osteoarthritis  POST-OPERATIVE DIAGNOSIS:  Right Medial Compartmental osteoarthritis  PROCEDURE:  Procedure(s): ARTHROPLASTY, KNEE, UNICOMPARTMENTAL (Right)  SURGEON:  Surgeons and Role:    Claiborne Crew, MD - Primary  PHYSICIAN ASSISTANT: Kim Pen, PA-C  ANESTHESIA:   regional and spinal  EBL:  <100 cc   BLOOD ADMINISTERED:none  DRAINS: none   LOCAL MEDICATIONS USED:  MARCAINE      SPECIMEN:  No Specimen  DISPOSITION OF SPECIMEN:  N/A  COUNTS:  YES  TOURNIQUET:  30 min at 225 mmHg  DICTATION: .Other Dictation: Dictation Number [id number]  PLAN OF CARE: Admit for overnight observation  PATIENT DISPOSITION:  PACU - hemodynamically stable.   Delay start of Pharmacological VTE agent (>24hrs) due to surgical blood loss or risk of bleeding: no

## 2023-06-11 NOTE — Discharge Instructions (Signed)

## 2023-06-11 NOTE — Op Note (Signed)
 NAMEETHAN, Oscar Valencia MEDICAL RECORD NO: [id number] ACCOUNT NO: [id number] DATE OF BIRTH: [birth year] FACILITY: [facility] LOCATION: [facility] PHYSICIAN: Azalea Lento. Bernard Brick, MD  Operative Report   DATE OF PROCEDURE: 06/11/2023  PREOPERATIVE DIAGNOSIS:  Right knee medial compartmental osteoarthritis.  POSTOPERATIVE DIAGNOSIS:  Right knee medial compartmental osteoarthritis.  PROCEDURE:  Right knee medial unicompartmental arthroplasty.  COMPONENTS USED:  [employer] fixed bearing knee system with a size right 4 femoral component, right E tibial baseplate with a size 8 insert to match the E tibial baseplate.  SURGEON:  Azalea Lento. Bernard Brick, MD  ASSISTANT:  Kim Pen, PA-C. Note that PA Cleotilde Dago was present for the entirety of the case from preoperative positioning, perioperative management of the operative extremity, general facilitation of the case and primary wound closure.  ANESTHESIA:  Regional plus spinal.  BLOOD LOSS:  Less than 100 mL.  DRAINS:  None.  COMPLICATIONS:  None.  TOURNIQUET:  225 mmHg for 30 minutes.  INDICATIONS FOR THE PROCEDURE:  The patient is a very pleasant active 73 year old male who presented for evaluation and followup with regard to his right knee pain.  He has had a history of a left partial knee replacement that he has done very well with.   He remains very active.  His right knee pain was mainly medially.  Based on his radiographs, he had advanced medial compartment osteoarthritis with perhaps mild degenerative changes within the patellofemoral compartment.  Based on his pain affecting  his quality of life and function.  Based on his history of his left partial knee replacement, we discussed definitive treatment in the form of partial versus total knee replacement.  He is aware of the risks of infection, DVT, component failure, and the  potential need for future surgeries based on progression of osteoarthritis.  We also reviewed the postoperative course and  expectations.  He wished to proceed with partial knee arthroplasty per our discussion.  Consent was obtained for the benefit of  pain relief.  DESCRIPTION OF PROCEDURE:  The patient was brought to the operative theater.  Once adequate anesthesia, preoperative antibiotics, Ancef  administered as well as tranexamic acid  he was positioned supine with a right thigh tourniquet placed.  The right  lower extremity was then prepped and draped in a sterile fashion.  A timeout was performed identifying the patient's planned procedure and extremity.  Leg was exsanguinated and the tourniquet elevated to 225 mmHg.  A paramidline incision was made.   Following soft tissue exposure, a partial median arthrotomy was made.  Following the initial exposure, I identified that he had intact patellar cartilage.  He did have some evidence of some chondral fissures on the trochlea, but no evidence of exposed  bone.  Medially, he had advanced degenerative changes with complete loss of joint space on both the femoral and tibial surfaces.  Following initial exposure and retractor placement and debridement of osteophytes in the medial meniscus, we used an  extramedullary guide.  With the 4 mm stylus, I made the resection off the proximal tibia aligned with the axis of flexion from the femoral component.  Once this was done, the knee was brought to extension.  The 9 mm distal femoral cutting block was  positioned and pinned.  The distal femoral cut was made.  We then flexed the knee and sized the femur to be a size 4.  It was pinned into place and lug holes and saw cuts made.  The tibial side was sized to be a size  E.  This was pinned into place and  lug holes drilled.  We did a trial reduction and found that the 8-mm insert was balanced in extension and flexion and particularly in extension with the 2 mm amber stick.  All trial components were removed.  Sclerotic bone was drilled to allow for cement  interdigitation.  We injected the  posterior capsule of the knee with 0.25% Marcaine  with epinephrine  and 1 mL of Toradol  and saline.  We irrigated the knee.  Cement was mixed.  Final components were opened.  Final components were then cemented onto  clean and dried cut surfaces of bone.  The knee was brought to extension with a 2-mm amber stick, to allow for further compression.  Once the cement had fully cured, excessive cement was removed throughout the knee.  We then selected the 8-mm insert  based on the trial reduction.  The final 8-mm insert to match the right E tibial component was opened and snapped into place.  The tourniquet was let down after 30 minutes without significant hemostasis required.  The knee was irrigated.  The extension  mechanism was reapproximated with a combination of #1 Vicryl and Stratafix sutures.  The remainder of the wound was closed with 2-0 Vicryl and running Monocryl stitch.  The wound was cleaned, dried, and dressed sterilely using surgical glue and Aquacel  dressing.  He was then brought to the recovery room in stable condition, tolerating the procedure well.  Plan will be for overnight observation with discharge tomorrow with therapy set up as an outpatient.   PUS D: 06/11/2023 8:49:41 am T: 06/11/2023 9:44:00 am  JOB: [id number]/ [id number]

## 2023-06-12 ENCOUNTER — Encounter (HOSPITAL_COMMUNITY): Payer: Self-pay | Admitting: Orthopedic Surgery

## 2023-06-12 DIAGNOSIS — M1711 Unilateral primary osteoarthritis, right knee: Secondary | ICD-10-CM | POA: Diagnosis not present

## 2023-06-12 LAB — CBC
HCT: 40.3 % (ref 39.0–52.0)
Hemoglobin: 13.3 g/dL (ref 13.0–17.0)
MCH: 31.4 pg (ref 26.0–34.0)
MCHC: 33 g/dL (ref 30.0–36.0)
MCV: 95 fL (ref 80.0–100.0)
Platelets: 217 10*3/uL (ref 150–400)
RBC: 4.24 MIL/uL (ref 4.22–5.81)
RDW: 12.6 % (ref 11.5–15.5)
WBC: 13.7 10*3/uL — ABNORMAL HIGH (ref 4.0–10.5)
nRBC: 0 % (ref 0.0–0.2)

## 2023-06-12 LAB — BASIC METABOLIC PANEL WITH GFR
Anion gap: 9 (ref 5–15)
BUN: 19 mg/dL (ref 8–23)
CO2: 25 mmol/L (ref 22–32)
Calcium: 9.1 mg/dL (ref 8.9–10.3)
Chloride: 102 mmol/L (ref 98–111)
Creatinine, Ser: 0.73 mg/dL (ref 0.61–1.24)
GFR, Estimated: >60 mL/min (ref >60)
Glucose, Bld: 137 mg/dL — ABNORMAL HIGH (ref 70–99)
Potassium: 4.1 mmol/L (ref 3.5–5.1)
Sodium: 136 mmol/L (ref 135–145)

## 2023-06-12 MED ORDER — ASPIRIN 81 MG PO CHEW: 81.0000 mg | CHEWABLE_TABLET | Freq: Two times a day (BID) | ORAL | Status: AC

## 2023-06-12 NOTE — TOC Transition Note (Signed)
 Transition of Care [facility]) - Discharge Note   Patient Details  Name: Oscar Valencia MRN: [id number] Date of Birth: 02/03/1951  [facility]) CM/SW Contact:  Bari Leys, RN Phone Number: 06/12/2023, 9:16 AM   Clinical Narrative:   Met with patient at bedside to review dc therapy and home equipment needs, pt confirmed OPPT with EO, has RW , no home equipment needs. No TOC needs.     Final next level of care: OP Rehab Barriers to Discharge: No Barriers Identified   Patient Goals and CMS Choice Patient states their goals for this hospitalization and ongoing recovery are:: return home          Discharge Placement                       Discharge Plan and Services Additional resources added to the After Visit Summary for                  DME Arranged: N/A DME Agency: NA                  Social Drivers of Health (SDOH) Interventions SDOH Screenings   Food Insecurity: No Food Insecurity (06/11/2023)  Housing: Patient Declined (06/11/2023)  Transportation Needs: No Transportation Needs (06/11/2023)  Utilities: Not At Risk (06/11/2023)  Social Connections: Unknown (06/11/2023)  Tobacco Use: Medium Risk (06/11/2023)     Readmission Risk Interventions     No data to display

## 2023-06-12 NOTE — Care Management Obs Status (Signed)
 MEDICARE OBSERVATION STATUS NOTIFICATION   Patient Details  Name: Oscar Valencia MRN: [id number] Date of Birth: May 11, 1950   Medicare Observation Status Notification Given:  Yes    Bari Leys, RN 06/12/2023, 9:14 AM

## 2023-06-12 NOTE — Plan of Care (Signed)
  Problem: Education: °Goal: Knowledge of the prescribed therapeutic regimen will improve °Outcome: Progressing °  °Problem: Activity: °Goal: Range of joint motion will improve °Outcome: Progressing °  °Problem: Clinical Measurements: °Goal: Postoperative complications will be avoided or minimized °Outcome: Progressing °  °Problem: Pain Management: °Goal: Pain level will decrease with appropriate interventions °Outcome: Progressing °  °

## 2023-06-12 NOTE — Progress Notes (Signed)
 Physical Therapy Treatment Patient Details Name: Oscar Valencia MRN: [id number] DOB: 06/05/1950 Today's Date: 06/12/2023   History of Present Illness 73 yo male s/p R UKA on 06/11/23. PMH: pancreatic cancer s/p surgical excision - 3 years ago, L UKA, prostate CA    PT Comments  Pt progressing very well, meeting goals and is ready to d/c home with wife assisting as needed from PT standpoint.    If plan is discharge home, recommend the following: Assistance with cooking/housework;Assist for transportation   Can travel by private vehicle        Equipment Recommendations  None recommended by PT    Recommendations for Other Services       Precautions / Restrictions Precautions Precautions: Fall;Knee Recall of Precautions/Restrictions: Intact Restrictions Weight Bearing Restrictions Per Provider Order: No Other Position/Activity Restrictions: WBAT     Mobility  Bed Mobility               General bed mobility comments: in recliner    Transfers Overall transfer level: Needs assistance Equipment used: Rolling walker (2 wheels) Transfers: Sit to/from Stand Sit to Stand: Supervision, Contact guard assist           General transfer comment: cues for hand placement and RLE position    Ambulation/Gait Ambulation/Gait assistance: Supervision Gait Distance (Feet): 200 Feet Assistive device: Rolling walker (2 wheels) Gait Pattern/deviations: Step-to pattern, Decreased stance time - right, Step-through pattern       General Gait Details: cues for gait progression. good stability with RW   Stairs Stairs: Yes Stairs assistance: Supervision Stair Management: One rail Left, Step to pattern, Forwards Number of Stairs: 3 General stair comments: cues for sequence; steady, no LOB, no knee instability noted   Wheelchair Mobility     Tilt Bed    Modified Rankin (Stroke Patients Only)       Balance                                             Communication Communication Communication: No apparent difficulties  Cognition Arousal: Alert Behavior During Therapy: WFL for tasks assessed/performed   PT - Cognitive impairments: No apparent impairments                         Following commands: Intact      Cueing Cueing Techniques: Verbal cues  Exercises Total Joint Exercises Ankle Circles/Pumps: AROM, Both, 10 reps Quad Sets: 5 reps, AROM, Both Short Arc Quad: AROM, Strengthening, Right, 10 reps Heel Slides: AAROM, Right, 10 reps Straight Leg Raises: AROM, Strengthening, Right, 10 reps    General Comments        Pertinent Vitals/Pain Pain Assessment Pain Assessment: 0-10 Pain Score: 3  Pain Location: right knee Pain Descriptors / Indicators: Discomfort Pain Intervention(s): Limited activity within patient's tolerance, Monitored during session, Premedicated before session, Repositioned, Ice applied    Home Living                          Prior Function            PT Goals (current goals can now be found in the care plan section) Acute Rehab PT Goals PT Goal Formulation: With patient Time For Goal Achievement: 06/18/23 Potential to Achieve Goals: Good Progress towards PT goals: Progressing toward goals  Frequency    7X/week      PT Plan      Co-evaluation              AM-PAC PT "6 Clicks" Mobility   Outcome Measure  Help needed turning from your back to your side while in a flat bed without using bedrails?: None Help needed moving from lying on your back to sitting on the side of a flat bed without using bedrails?: None Help needed moving to and from a bed to a chair (including a wheelchair)?: A Little Help needed standing up from a chair using your arms (e.g., wheelchair or bedside chair)?: None Help needed to walk in hospital room?: None Help needed climbing 3-5 steps with a railing? : A Little 6 Click Score: 22    End of Session Equipment Utilized During  Treatment: Gait belt Activity Tolerance: Patient tolerated treatment well Patient left: with call bell/phone within reach;in chair;with chair alarm set   PT Visit Diagnosis: Other abnormalities of gait and mobility (R26.89)     Time: 1040-1058 PT Time Calculation (min) (ACUTE ONLY): 18 min  Charges:    $Gait Training: 8-22 mins PT General Charges $$ ACUTE PT VISIT: 1 Visit                     Almira Phetteplace, PT  Acute Rehab Dept ([facility]) [phone]  06/12/2023    [facility] 06/12/2023, 11:06 AM

## 2023-06-12 NOTE — Progress Notes (Addendum)
   Subjective: 1 Day Post-Op Procedure(s) (LRB): ARTHROPLASTY, KNEE, UNICOMPARTMENTAL (Right) Patient reports pain as mild.   Patient seen in rounds with Dr. Bernard Brick. Patient is well, and has had no acute complaints or problems. No acute events overnight. Foley catheter removed. Patient ambulated 70 feet with PT.  We will start therapy today.   Objective: Vital signs in last 24 hours: Temp:  [97.7 F (36.5 C)-98.1 F (36.7 C)] 97.8 F (36.6 C) (04/23 0552) Pulse Rate:  [53-69] 59 (04/23 0552) Resp:  [11-17] 17 (04/23 0552) BP: (104-139)/(73-89) 139/79 (04/23 0552) SpO2:  [94 %-100 %] 99 % (04/23 0552)  Intake/Output from previous day:  Intake/Output Summary (Last 24 hours) at 06/12/2023 0811 Last data filed at 06/12/2023 0600 Gross per 24 hour  Intake 2140 ml  Output 2320 ml  Net -180 ml     Intake/Output this shift: No intake/output data recorded.  Labs: Recent Labs    06/12/23 0333  HGB 13.3   Recent Labs    06/12/23 0333  WBC 13.7*  RBC 4.24  HCT 40.3  PLT 217   Recent Labs    06/12/23 0333  NA 136  K 4.1  CL 102  CO2 25  BUN 19  CREATININE 0.73  GLUCOSE 137*  CALCIUM  9.1   No results for input(s): "LABPT", "INR" in the last 72 hours.  Exam: General - Patient is Alert and Oriented Extremity - Neurologically intact Sensation intact distally Intact pulses distally Dorsiflexion/Plantar flexion intact Dressing - dressing C/D/I Motor Function - intact, moving foot and toes well on exam.   Past Medical History:  Diagnosis Date   Arthritis    Cataract    surgery to remove   GERD (gastroesophageal reflux disease)    Hyperlipidemia    Prostate cancer (HCC)    Skin cancer    UTI (urinary tract infection)     Assessment/Plan: 1 Day Post-Op Procedure(s) (LRB): ARTHROPLASTY, KNEE, UNICOMPARTMENTAL (Right) Principal Problem:   S/P right unicompartmental knee replacement  Estimated body mass index is 23.68 kg/m as calculated from the  following:   Height as of this encounter: 5\' 10"  (1.778 m).   Weight as of this encounter: 74.8 kg. Advance diet Up with therapy D/C IV fluids   Patient's anticipated LOS is less than 2 midnights, meeting these requirements: - Younger than 61 - Lives within 1 hour of care - Has a competent adult at home to recover with post-op recover - NO history of  - Chronic pain requiring opiods  - Diabetes  - Coronary Artery Disease  - Heart failure  - Heart attack  - Stroke  - DVT/VTE  - Cardiac arrhythmia  - Respiratory Failure/COPD  - Renal failure  - Anemia  - Advanced Liver disease     DVT Prophylaxis - Aspirin  Weight bearing as tolerated.  Hgb stable at 13.3 this AM  Plan is to go Home after hospital stay. Plan for discharge today following 1-2 sessions of PT as long as they are meeting their goals. Patient is scheduled for OPPT. Follow up in the office in 2 weeks.   Patient has all post op meds at home   Kim Pen, [facility] [phone] 06/12/2023, 8:11 AM

## 2023-06-14 DIAGNOSIS — M25561 Pain in right knee: Secondary | ICD-10-CM | POA: Diagnosis not present

## 2023-06-18 NOTE — Discharge Summary (Signed)
 Patient ID: Oscar Valencia MRN: [id number] DOB/AGE: 05-13-1950 73 y.o.  Admit date: 06/11/2023 Discharge date: 06/12/2023  Admission Diagnoses:  Right knee medial compartment osteoarthritis  Discharge Diagnoses:  Principal Problem:   S/P right unicompartmental knee replacement   Past Medical History:  Diagnosis Date   Arthritis    Cataract    surgery to remove   GERD (gastroesophageal reflux disease)    Hyperlipidemia    Prostate cancer (HCC)    Skin cancer    UTI (urinary tract infection)     Surgeries: Procedure(s): ARTHROPLASTY, KNEE, UNICOMPARTMENTAL on 06/11/2023   Consultants:   Discharged Condition: Improved  Hospital Course: Oscar Valencia is an 73 y.o. male who was admitted 06/11/2023 for operative treatment ofS/P right unicompartmental knee replacement. Patient has severe unremitting pain that affects sleep, daily activities, and work/hobbies. After pre-op clearance the patient was taken to the operating room on 06/11/2023 and underwent  Procedure(s): ARTHROPLASTY, KNEE, UNICOMPARTMENTAL.    Patient was given perioperative antibiotics:  Anti-infectives (From admission, onward)    Start     Dose/Rate Route Frequency Ordered Stop   06/11/23 1300  ceFAZolin  (ANCEF ) IVPB 2g/100 mL premix        2 g 200 mL/hr over 30 Minutes Intravenous Every 6 hours 06/11/23 1011 06/11/23 1920   06/11/23 0615  ceFAZolin  (ANCEF ) IVPB 2g/100 mL premix        2 g 200 mL/hr over 30 Minutes Intravenous On call to O.R. 06/11/23 [id number] 06/11/23 0716        Patient was given sequential compression devices, early ambulation, and chemoprophylaxis to prevent DVT. Patient worked with PT and was meeting their goals regarding safe ambulation and transfers.  Patient benefited maximally from hospital stay and there were no complications.    Recent vital signs: No data found.   Recent laboratory studies: No results for input(s): "WBC", "HGB", "HCT", "PLT", "NA", "K", "CL", "CO2", "BUN", "CREATININE",  "GLUCOSE", "INR", "CALCIUM " in the last 72 hours.  Invalid input(s): "PT", "2"   Discharge Medications:   Allergies as of 06/12/2023       Reactions   Penicillins Other (See Comments)   Unknown-childhood allergy, does not know what happens with supposed allergy. Tolerated Cephalosporin Date: 06/12/23.        Medication List     TAKE these medications    aspirin  81 MG chewable tablet Chew 1 tablet (81 mg total) by mouth 2 (two) times daily for 28 days.   atorvastatin  20 MG tablet Commonly known as: LIPITOR Take 20 mg by mouth at bedtime.   celecoxib  200 MG capsule Commonly known as: CELEBREX  Take 200 mg by mouth 2 (two) times daily.   ondansetron  4 MG disintegrating tablet Commonly known as: ZOFRAN -ODT Take 4 mg by mouth every 8 (eight) hours as needed for nausea or vomiting.   oxyCODONE  5 MG immediate release tablet Commonly known as: Oxy IR/ROXICODONE  Take 5 mg by mouth every 4 (four) hours as needed (pain.).               Discharge Care Instructions  (From admission, onward)           Start     Ordered   06/12/23 0000  Change dressing       Comments: Maintain surgical dressing until follow up in the clinic. If the edges start to pull up, may reinforce with tape. If the dressing is no longer working, may remove and cover with gauze and tape, but must keep the area  dry and clean.  Call with any questions or concerns.   06/12/23 0813            Diagnostic Studies: No results found.  Disposition: Discharge disposition: 01-Home or Self Care       Discharge Instructions     Call MD / Call 911   Complete by: As directed    If you experience chest pain or shortness of breath, CALL 911 and be transported to the hospital emergency room.  If you develope a fever above 101 F, pus (white drainage) or increased drainage or redness at the wound, or calf pain, call your surgeon's office.   Change dressing   Complete by: As directed    Maintain  surgical dressing until follow up in the clinic. If the edges start to pull up, may reinforce with tape. If the dressing is no longer working, may remove and cover with gauze and tape, but must keep the area dry and clean.  Call with any questions or concerns.   Constipation Prevention   Complete by: As directed    Drink plenty of fluids.  Prune juice may be helpful.  You may use a stool softener, such as Colace (over the counter) 100 mg twice a day.  Use MiraLax  (over the counter) for constipation as needed.   Diet - low sodium heart healthy   Complete by: As directed    Increase activity slowly as tolerated   Complete by: As directed    Weight bearing as tolerated with assist device (walker, cane, etc) as directed, use it as long as suggested by your surgeon or therapist, typically at least 4-6 weeks.   Post-operative opioid taper instructions:   Complete by: As directed    POST-OPERATIVE OPIOID TAPER INSTRUCTIONS: It is important to wean off of your opioid medication as soon as possible. If you do not need pain medication after your surgery it is ok to stop day one. Opioids include: Codeine, Hydrocodone(Norco, Vicodin), Oxycodone (Percocet, oxycontin ) and hydromorphone  amongst others.  Long term and even short term use of opiods can cause: Increased pain response Dependence Constipation Depression Respiratory depression And more.  Withdrawal symptoms can include Flu like symptoms Nausea, vomiting And more Techniques to manage these symptoms Hydrate well Eat regular healthy meals Stay active Use relaxation techniques(deep breathing, meditating, yoga) Do Not substitute Alcohol  to help with tapering If you have been on opioids for less than two weeks and do not have pain than it is ok to stop all together.  Plan to wean off of opioids This plan should start within one week post op of your joint replacement. Maintain the same interval or time between taking each dose and first  decrease the dose.  Cut the total daily intake of opioids by one tablet each day Next start to increase the time between doses. The last dose that should be eliminated is the evening dose.      TED hose   Complete by: As directed    Use stockings (TED hose) for 2 weeks on both leg(s).  You may remove them at night for sleeping.        Follow-up Information     Earnie Gola, PA-C. Go on 06/27/2023.   Specialty: Orthopedic Surgery Why: You are scheduled for a post op appointment on Thursday 06/27/23 at 12:00pm Contact information: [address] [place] [address] [phone]         Alan All.. Go on 06/14/2023.   Why: You  are scheduled for a physical therapy appointment Friday 06/14/23 at 1:30pm [address] Contact information: [address] [place] [address] [phone]                  Signed: Earnie Gola 06/18/2023, 7:17 AM

## 2023-06-19 DIAGNOSIS — M25561 Pain in right knee: Secondary | ICD-10-CM | POA: Diagnosis not present

## 2023-06-27 DIAGNOSIS — M25561 Pain in right knee: Secondary | ICD-10-CM | POA: Diagnosis not present

## 2023-07-01 DIAGNOSIS — M25561 Pain in right knee: Secondary | ICD-10-CM | POA: Diagnosis not present

## 2023-07-04 DIAGNOSIS — M25561 Pain in right knee: Secondary | ICD-10-CM | POA: Diagnosis not present

## 2023-07-12 DIAGNOSIS — M179 Osteoarthritis of knee, unspecified: Secondary | ICD-10-CM | POA: Diagnosis not present

## 2023-07-12 DIAGNOSIS — E785 Hyperlipidemia, unspecified: Secondary | ICD-10-CM | POA: Diagnosis not present

## 2023-07-12 DIAGNOSIS — I700 Atherosclerosis of aorta: Secondary | ICD-10-CM | POA: Diagnosis not present

## 2023-07-12 DIAGNOSIS — R030 Elevated blood-pressure reading, without diagnosis of hypertension: Secondary | ICD-10-CM | POA: Diagnosis not present

## 2023-07-16 DIAGNOSIS — M25561 Pain in right knee: Secondary | ICD-10-CM | POA: Diagnosis not present

## 2023-07-18 DIAGNOSIS — M25561 Pain in right knee: Secondary | ICD-10-CM | POA: Diagnosis not present

## 2023-07-29 DIAGNOSIS — Z5189 Encounter for other specified aftercare: Secondary | ICD-10-CM | POA: Diagnosis not present

## 2023-09-06 DIAGNOSIS — I10 Essential (primary) hypertension: Secondary | ICD-10-CM | POA: Diagnosis not present

## 2023-09-11 DIAGNOSIS — M1611 Unilateral primary osteoarthritis, right hip: Secondary | ICD-10-CM | POA: Diagnosis not present

## 2023-09-11 DIAGNOSIS — Z5189 Encounter for other specified aftercare: Secondary | ICD-10-CM | POA: Diagnosis not present

## 2023-09-11 DIAGNOSIS — M25551 Pain in right hip: Secondary | ICD-10-CM | POA: Diagnosis not present

## 2023-11-12 NOTE — Progress Notes (Signed)
 COVID Vaccine received:  []  No [x]  Yes Date of any COVID positive Test in last 90 days: no PCP - Elsie Gentry MD Cardiologist - no  Chest x-ray -  EKG -   Stress Test -  ECHO -  Cardiac Cath -   Bowel Prep - [x]  No  []   Yes ______  Pacemaker / ICD device [x]  No []  Yes   Spinal Cord Stimulator:[x]  No []  Yes       History of Sleep Apnea? [x]  No []  Yes   CPAP used?- [x]  No []  Yes    Does the patient monitor blood sugar?          [x]  No []  Yes  []  N/A  Patient has: [x]  NO Hx DM   []  Pre-DM                 []  DM1  []   DM2 Does patient have a [employer] or Dexacom? []  No []  Yes   Fasting Blood Sugar Ranges-  Checks Blood Sugar _____ times a day  GLP1 agonist / usual dose - no GLP1 instructions:  SGLT-2 inhibitors / usual dose - no SGLT-2 instructions:   Blood Thinner / Instructions no Aspirin  Instructions:no  Comments:   Activity level: Patient is able to climb a flight of stairs without difficulty; [x]  No CP  [x]  No SOB,  Patient can perform ADLs without assistance.   Anesthesia review:   Patient denies shortness of breath, fever, cough and chest pain at PAT appointment.  Patient verbalized understanding and agreement to the Pre-Surgical Instructions that were given to them at this PAT appointment. Patient was also educated of the need to review these PAT instructions again prior to his/her surgery.I reviewed the appropriate phone numbers to call if they have any and questions or concerns.

## 2023-11-12 NOTE — Patient Instructions (Signed)
 SURGICAL WAITING ROOM VISITATION  Patients having surgery or a procedure may have no more than 2 support people in the waiting area - these visitors may rotate.    Children under the age of 33 must have an adult with them who is not the patient.  Visitors with respiratory illnesses are discouraged from visiting and should remain at home.  If the patient needs to stay at the hospital during part of their recovery, the visitor guidelines for inpatient rooms apply. Pre-op nurse will coordinate an appropriate time for 1 support person to accompany patient in pre-op.  This support person may not rotate.    Please refer to the [facility] website for the visitor guidelines for Inpatients (after your surgery is over and you are in a regular room).       Your procedure is scheduled on: 11/21/23   Report to [facility] Main Entrance    Report to admitting at 7:30 AM   Call this number if you have problems the morning of surgery [phone]   Do not eat food :After Midnight.   After Midnight you may have the following liquids until 7 AM DAY OF SURGERY  Water  Non-Citrus Juices (without pulp, NO RED-Apple, White grape, White cranberry) Black Coffee (NO MILK/CREAM OR CREAMERS, sugar ok)  Clear Tea (NO MILK/CREAM OR CREAMERS, sugar ok) regular and decaf                             Plain Jell-O (NO RED)                                           Fruit ices (not with fruit pulp, NO RED)                                     Popsicles (NO RED)                                                               Sports drinks like Gatorade (NO RED)                The day of surgery:  Drink ONE (1) Pre-Surgery Clear Ensure  at 7 AM the morning of surgery. Drink in one sitting. Do not sip.  This drink was given to you during your hospital  pre-op appointment visit. Nothing else to drink after completing the  Pre-Surgery Clear Ensure.    Oral Hygiene is also important to reduce your risk of  infection.                                    Remember - BRUSH YOUR TEETH THE MORNING OF SURGERY WITH YOUR REGULAR TOOTHPASTE  DENTURES WILL BE REMOVED PRIOR TO SURGERY PLEASE DO NOT APPLY Poly grip OR ADHESIVES!!!   Stop all vitamins and herbal supplements 7 days before surgery.   Take these medicines the morning of surgery with A SIP OF WATER :  Atorvastatin   You may not have any metal on your body including hair pins, jewelry, and body piercing             Do not wear make-up, lotions, powders, perfumes/cologne, or deodorant              Men may shave face and neck.   Do not bring valuables to the hospital. [facility] IS NOT             RESPONSIBLE   FOR VALUABLES.   Contacts, glasses, dentures or bridgework may not be worn into surgery.  DO NOT BRING YOUR HOME MEDICATIONS TO THE HOSPITAL. PHARMACY WILL DISPENSE MEDICATIONS LISTED ON YOUR MEDICATION LIST TO YOU DURING YOUR ADMISSION IN THE HOSPITAL!    Patients discharged on the day of surgery will not be allowed to drive home.  Someone NEEDS to stay with you for the first 24 hours after anesthesia.   Special Instructions: Bring a copy of your healthcare power of attorney and living will documents the day of surgery if you haven't scanned them before.              Please read over the following fact sheets you were given: IF YOU HAVE QUESTIONS ABOUT YOUR PRE-OP INSTRUCTIONS PLEASE CALL [phone] [place]   If you received a COVID test during your pre-op visit  it is requested that you wear a mask when out in public, stay away from anyone that may not be feeling well and notify your surgeon if you develop symptoms. If you test positive for Covid or have been in contact with anyone that has tested positive in the last 10 days please notify you surgeon.      Pre-operative 5 CHG Bath Instructions   You can play a key role in reducing the risk of infection after surgery. Your skin needs to be as free of germs as possible.  You can reduce the number of germs on your skin by washing with CHG (chlorhexidine  gluconate) soap before surgery. CHG is an antiseptic soap that kills germs and continues to kill germs even after washing.   DO NOT use if you have an allergy to chlorhexidine /CHG or antibacterial soaps. If your skin becomes reddened or irritated, stop using the CHG and notify one of our RNs at [phone].   Please shower with the CHG soap starting 4 days before surgery using the following schedule:     Please keep in mind the following:  DO NOT shave, including legs and underarms, starting the day of your first shower.   You may shave your face at any point before/day of surgery.  Place clean sheets on your bed the day you start using CHG soap. Use a clean washcloth (not used since being washed) for each shower. DO NOT sleep with pets once you start using the CHG.   CHG Shower Instructions:  If you choose to wash your hair and private area, wash first with your normal shampoo/soap.  After you use shampoo/soap, rinse your hair and body thoroughly to remove shampoo/soap residue.  Turn the water  OFF and apply about 3 tablespoons (45 ml) of CHG soap to a CLEAN washcloth.  Apply CHG soap ONLY FROM YOUR NECK DOWN TO YOUR TOES (washing for 3-5 minutes)  DO NOT use CHG soap on face, private areas, open wounds, or sores.  Pay special attention to the area where your surgery is being performed.  If you are having back surgery, having someone wash your back for you  may be helpful. Wait 2 minutes after CHG soap is applied, then you may rinse off the CHG soap.  Pat dry with a clean towel  Put on clean clothes/pajamas   If you choose to wear lotion, please use ONLY the CHG-compatible lotions on the back of this paper.     Additional instructions for the day of surgery: DO NOT APPLY any lotions, deodorants, cologne, or perfumes.   Put on clean/comfortable clothes.  Brush your teeth.  Ask your nurse before applying  any prescription medications to the skin.      CHG Compatible Lotions   Aveeno Moisturizing lotion  Cetaphil Moisturizing Cream  Cetaphil Moisturizing Lotion  Clairol Herbal Essence Moisturizing Lotion, Dry Skin  Clairol Herbal Essence Moisturizing Lotion, Extra Dry Skin  Clairol Herbal Essence Moisturizing Lotion, Normal Skin  Curel Age Defying Therapeutic Moisturizing Lotion with Alpha Hydroxy  Curel Extreme Care Body Lotion  Curel Soothing Hands Moisturizing Hand Lotion  Curel Therapeutic Moisturizing Cream, Fragrance-Free  Curel Therapeutic Moisturizing Lotion, Fragrance-Free  Curel Therapeutic Moisturizing Lotion, Original Formula  Eucerin Daily Replenishing Lotion  Eucerin Dry Skin Therapy Plus Alpha Hydroxy Crme  Eucerin Dry Skin Therapy Plus Alpha Hydroxy Lotion  Eucerin Original Crme  Eucerin Original Lotion  Eucerin Plus Crme Eucerin Plus Lotion  Eucerin TriLipid Replenishing Lotion  Keri Anti-Bacterial Hand Lotion  Keri Deep Conditioning Original Lotion Dry Skin Formula Softly Scented  Keri Deep Conditioning Original Lotion, Fragrance Free Sensitive Skin Formula  Keri Lotion Fast Absorbing Fragrance Free Sensitive Skin Formula  Keri Lotion Fast Absorbing Softly Scented Dry Skin Formula  Keri Original Lotion  Keri Skin Renewal Lotion Keri Silky Smooth Lotion  Keri Silky Smooth Sensitive Skin Lotion  Nivea Body Creamy Conditioning Oil  Nivea Body Extra Enriched Lotion  Nivea Body Original Lotion  Nivea Body Sheer Moisturizing Lotion Nivea Crme  Nivea Skin Firming Lotion  NutraDerm 30 Skin Lotion  NutraDerm Skin Lotion  NutraDerm Therapeutic Skin Cream  NutraDerm Therapeutic Skin Lotion  ProShield Protective Hand Cream   Incentive Spirometer  An incentive spirometer is a tool that can help keep your lungs clear and active. This tool measures how well you are filling your lungs with each breath. Taking long deep breaths may help reverse or decrease the  chance of developing breathing (pulmonary) problems (especially infection) following: A long period of time when you are unable to move or be active. BEFORE THE PROCEDURE  If the spirometer includes an indicator to show your best effort, your nurse or respiratory therapist will set it to a desired goal. If possible, sit up straight or lean slightly forward. Try not to slouch. Hold the incentive spirometer in an upright position. INSTRUCTIONS FOR USE  Sit on the edge of your bed if possible, or sit up as far as you can in bed or on a chair. Hold the incentive spirometer in an upright position. Breathe out normally. Place the mouthpiece in your mouth and seal your lips tightly around it. Breathe in slowly and as deeply as possible, raising the piston or the ball toward the top of the column. Hold your breath for 3-5 seconds or for as long as possible. Allow the piston or ball to fall to the bottom of the column. Remove the mouthpiece from your mouth and breathe out normally. Rest for a few seconds and repeat Steps 1 through 7 at least 10 times every 1-2 hours when you are awake. Take your time and take a few normal breaths between deep  breaths. The spirometer may include an indicator to show your best effort. Use the indicator as a goal to work toward during each repetition. After each set of 10 deep breaths, practice coughing to be sure your lungs are clear. If you have an incision (the cut made at the time of surgery), support your incision when coughing by placing a pillow or rolled up towels firmly against it. Once you are able to get out of bed, walk around indoors and cough well. You may stop using the incentive spirometer when instructed by your caregiver.  RISKS AND COMPLICATIONS Take your time so you do not get dizzy or light-headed. If you are in pain, you may need to take or ask for pain medication before doing incentive spirometry. It is harder to take a deep breath if you are having  pain. AFTER USE Rest and breathe slowly and easily. It can be helpful to keep track of a log of your progress. Your caregiver can provide you with a simple table to help with this. If you are using the spirometer at home, follow these instructions: SEEK MEDICAL CARE IF:  You are having difficultly using the spirometer. You have trouble using the spirometer as often as instructed. Your pain medication is not giving enough relief while using the spirometer. You develop fever of 100.5 F (38.1 C) or higher. SEEK IMMEDIATE MEDICAL CARE IF:  You cough up bloody sputum that had not been present before. You develop fever of 102 F (38.9 C) or greater. You develop worsening pain at or near the incision site. MAKE SURE YOU:  Understand these instructions. Will watch your condition. Will get help right away if you are not doing well or get worse. Document Released: 06/18/2006 Document Revised: 04/30/2011 Document Reviewed: 08/19/2006  WHAT IS A BLOOD TRANSFUSION? Blood Transfusion Information  A transfusion is the replacement of blood or some of its parts. Blood is made up of multiple cells which provide different functions. Red blood cells carry oxygen and are used for blood loss replacement. White blood cells fight against infection. Platelets control bleeding. Plasma helps clot blood. Other blood products are available for specialized needs, such as hemophilia or other clotting disorders. BEFORE THE TRANSFUSION  Who gives blood for transfusions?  Healthy volunteers who are fully evaluated to make sure their blood is safe. This is blood bank blood. Transfusion therapy is the safest it has ever been in the practice of medicine. Before blood is taken from a donor, a complete history is taken to make sure that person has no history of diseases nor engages in risky social behavior (examples are intravenous drug use or sexual activity with multiple partners). The donor's travel history is  screened to minimize risk of transmitting infections, such as malaria. The donated blood is tested for signs of infectious diseases, such as HIV and hepatitis. The blood is then tested to be sure it is compatible with you in order to minimize the chance of a transfusion reaction. If you or a relative donates blood, this is often done in anticipation of surgery and is not appropriate for emergency situations. It takes many days to process the donated blood. RISKS AND COMPLICATIONS Although transfusion therapy is very safe and saves many lives, the main dangers of transfusion include:  Getting an infectious disease. Developing a transfusion reaction. This is an allergic reaction to something in the blood you were given. Every precaution is taken to prevent this. The decision to have a blood transfusion has been  considered carefully by your caregiver before blood is given. Blood is not given unless the benefits outweigh the risks. AFTER THE TRANSFUSION Right after receiving a blood transfusion, you will usually feel much better and more energetic. This is especially true if your red blood cells have gotten low (anemic). The transfusion raises the level of the red blood cells which carry oxygen, and this usually causes an energy increase. The nurse administering the transfusion will monitor you carefully for complications. HOME CARE INSTRUCTIONS  No special instructions are needed after a transfusion. You may find your energy is better. Speak with your caregiver about any limitations on activity for underlying diseases you may have. SEEK MEDICAL CARE IF:  Your condition is not improving after your transfusion. You develop redness or irritation at the intravenous (IV) site. SEEK IMMEDIATE MEDICAL CARE IF:  Any of the following symptoms occur over the next 12 hours: Shaking chills. You have a temperature by mouth above 102 F (38.9 C), not controlled by medicine. Chest, back, or muscle pain. People  around you feel you are not acting correctly or are confused. Shortness of breath or difficulty breathing. Dizziness and fainting. You get a rash or develop hives. You have a decrease in urine output. Your urine turns a dark color or changes to pink, red, or brown. Any of the following symptoms occur over the next 10 days: You have a temperature by mouth above 102 F (38.9 C), not controlled by medicine. Shortness of breath. Weakness after normal activity. The white part of the eye turns yellow (jaundice). You have a decrease in the amount of urine or are urinating less often. Your urine turns a dark color or changes to pink, red, or brown. Document Released: 02/03/2000 Document Revised: 04/30/2011 Document Reviewed: 09/22/2007 [facility] Patient Information 2014 [place], [place].

## 2023-11-14 ENCOUNTER — Encounter (HOSPITAL_COMMUNITY): Payer: Self-pay

## 2023-11-14 ENCOUNTER — Encounter (HOSPITAL_COMMUNITY)
Admission: RE | Admit: 2023-11-14 | Discharge: 2023-11-14 | Disposition: A | Discharge status: Home / Self Care | Source: Ambulatory Visit | Attending: Orthopedic Surgery | Admitting: Orthopedic Surgery

## 2023-11-14 ENCOUNTER — Other Ambulatory Visit: Payer: Self-pay

## 2023-11-14 VITALS — BP 161/93 | HR 59 | Temp 97.9°F | Resp 16 | Ht 70.0 in | Wt 168.0 lb

## 2023-11-14 DIAGNOSIS — Z01812 Encounter for preprocedural laboratory examination: Secondary | ICD-10-CM | POA: Insufficient documentation

## 2023-11-14 DIAGNOSIS — M1611 Unilateral primary osteoarthritis, right hip: Secondary | ICD-10-CM | POA: Insufficient documentation

## 2023-11-14 DIAGNOSIS — Z01818 Encounter for other preprocedural examination: Secondary | ICD-10-CM

## 2023-11-14 LAB — CBC
HCT: 48.3 % (ref 39.0–52.0)
Hemoglobin: 15 g/dL (ref 13.0–17.0)
MCH: 29.6 pg (ref 26.0–34.0)
MCHC: 31.1 g/dL (ref 30.0–36.0)
MCV: 95.5 fL (ref 80.0–100.0)
Platelets: 239 K/uL (ref 150–400)
RBC: 5.06 MIL/uL (ref 4.22–5.81)
RDW: 13.1 % (ref 11.5–15.5)
WBC: 7.4 K/uL (ref 4.0–10.5)
nRBC: 0 % (ref 0.0–0.2)

## 2023-11-14 LAB — SURGICAL PCR SCREEN
MRSA, PCR: NEGATIVE
Staphylococcus aureus: NEGATIVE

## 2023-11-14 LAB — BASIC METABOLIC PANEL WITH GFR
Anion gap: 11 (ref 5–15)
BUN: 14 mg/dL (ref 8–23)
CO2: 26 mmol/L (ref 22–32)
Calcium: 10 mg/dL (ref 8.9–10.3)
Chloride: 104 mmol/L (ref 98–111)
Creatinine, Ser: 0.71 mg/dL (ref 0.61–1.24)
GFR, Estimated: >60 mL/min (ref >60)
Glucose, Bld: 92 mg/dL (ref 70–99)
Potassium: 4.3 mmol/L (ref 3.5–5.1)
Sodium: 141 mmol/L (ref 135–145)

## 2023-11-21 ENCOUNTER — Encounter (HOSPITAL_COMMUNITY)
Admission: RE | Disposition: A | Discharge status: Home / Self Care | Payer: Self-pay | Source: Home / Self Care | Attending: Orthopedic Surgery

## 2023-11-21 ENCOUNTER — Ambulatory Visit (HOSPITAL_COMMUNITY)
Admission: RE | Admit: 2023-11-21 | Discharge: 2023-11-21 | Disposition: A | Discharge status: Home / Self Care | Attending: Orthopedic Surgery | Admitting: Orthopedic Surgery

## 2023-11-21 ENCOUNTER — Other Ambulatory Visit: Payer: Self-pay

## 2023-11-21 ENCOUNTER — Ambulatory Visit (HOSPITAL_BASED_OUTPATIENT_CLINIC_OR_DEPARTMENT_OTHER): Admitting: Certified Registered"

## 2023-11-21 ENCOUNTER — Ambulatory Visit (HOSPITAL_COMMUNITY): Admitting: Certified Registered"

## 2023-11-21 ENCOUNTER — Ambulatory Visit (HOSPITAL_COMMUNITY)

## 2023-11-21 ENCOUNTER — Encounter (HOSPITAL_COMMUNITY): Payer: Self-pay | Admitting: Orthopedic Surgery

## 2023-11-21 DIAGNOSIS — Z96641 Presence of right artificial hip joint: Secondary | ICD-10-CM

## 2023-11-21 DIAGNOSIS — M1612 Unilateral primary osteoarthritis, left hip: Secondary | ICD-10-CM | POA: Diagnosis not present

## 2023-11-21 DIAGNOSIS — Z471 Aftercare following joint replacement surgery: Secondary | ICD-10-CM | POA: Diagnosis not present

## 2023-11-21 DIAGNOSIS — K219 Gastro-esophageal reflux disease without esophagitis: Secondary | ICD-10-CM | POA: Insufficient documentation

## 2023-11-21 DIAGNOSIS — Z87891 Personal history of nicotine dependence: Secondary | ICD-10-CM | POA: Insufficient documentation

## 2023-11-21 DIAGNOSIS — M1611 Unilateral primary osteoarthritis, right hip: Secondary | ICD-10-CM

## 2023-11-21 HISTORY — PX: TOTAL HIP ARTHROPLASTY: SHX124

## 2023-11-21 LAB — TYPE AND SCREEN
ABO/RH(D): A POS
Antibody Screen: NEGATIVE

## 2023-11-21 SURGERY — ARTHROPLASTY, HIP, TOTAL, ANTERIOR APPROACH
Anesthesia: Spinal | Site: Hip | Laterality: Right

## 2023-11-21 MED ORDER — SODIUM CHLORIDE (PF) 0.9 % IJ SOLN
INTRAMUSCULAR | Status: AC
Start: 2023-11-21 — End: 2023-11-21
  Filled 2023-11-21: qty 30

## 2023-11-21 MED ORDER — CHLORHEXIDINE GLUCONATE 0.12 % MT SOLN
15.0000 mL | Freq: Once | OROMUCOSAL | Status: AC
  Administered 2023-11-21: 15 mL via OROMUCOSAL

## 2023-11-21 MED ORDER — POVIDONE-IODINE 10 % EX SWAB: 2.0000 | Freq: Once | CUTANEOUS | Status: DC

## 2023-11-21 MED ORDER — GLYCOPYRROLATE 0.2 MG/ML IJ SOLN
INTRAMUSCULAR | Status: DC | PRN
Start: 2023-11-21 — End: 2023-11-21
  Administered 2023-11-21: .2 mg via INTRAVENOUS

## 2023-11-21 MED ORDER — CELECOXIB 200 MG PO CAPS: 200.0000 mg | ORAL_CAPSULE | Freq: Two times a day (BID) | ORAL | 0 refills | Status: AC

## 2023-11-21 MED ORDER — PHENYLEPHRINE HCL-NACL 20-0.9 MG/250ML-% IV SOLN
INTRAVENOUS | Status: AC
  Filled 2023-11-21: qty 500

## 2023-11-21 MED ORDER — TRANEXAMIC ACID-NACL 1000-0.7 MG/100ML-% IV SOLN
1000.0000 mg | INTRAVENOUS | Status: AC
  Administered 2023-11-21: 1000 mg via INTRAVENOUS
  Filled 2023-11-21: qty 100

## 2023-11-21 MED ORDER — POLYETHYLENE GLYCOL 3350 17 G PO PACK: 17.0000 g | PACK | Freq: Two times a day (BID) | ORAL | Status: AC

## 2023-11-21 MED ORDER — CEFAZOLIN SODIUM-DEXTROSE 2-4 GM/100ML-% IV SOLN: 2.0000 g | Freq: Four times a day (QID) | INTRAVENOUS | Status: DC

## 2023-11-21 MED ORDER — PROPOFOL 10 MG/ML IV BOLUS
INTRAVENOUS | Status: DC | PRN
  Administered 2023-11-21: 70 mg via INTRAVENOUS
  Administered 2023-11-21: 30 mg via INTRAVENOUS

## 2023-11-21 MED ORDER — MEPIVACAINE HCL (PF) 2 % IJ SOLN
INTRAMUSCULAR | Status: DC | PRN
  Administered 2023-11-21: 3 mL via EPIDURAL

## 2023-11-21 MED ORDER — OXYCODONE HCL 5 MG/5ML PO SOLN: 5.0000 mg | Freq: Once | ORAL | Status: DC | PRN

## 2023-11-21 MED ORDER — LACTATED RINGERS IV SOLN
INTRAVENOUS | Status: DC
Start: 2023-11-21 — End: 2023-11-21

## 2023-11-21 MED ORDER — KETOROLAC TROMETHAMINE 30 MG/ML IJ SOLN
INTRAMUSCULAR | Status: AC
Start: 2023-11-21 — End: 2023-11-21
  Filled 2023-11-21: qty 1

## 2023-11-21 MED ORDER — SENNA 8.6 MG PO TABS: 1.0000 | ORAL_TABLET | Freq: Every day | ORAL | 0 refills | Status: AC

## 2023-11-21 MED ORDER — OXYCODONE HCL 5 MG PO TABS: 5.0000 mg | ORAL_TABLET | Freq: Once | ORAL | Status: DC | PRN

## 2023-11-21 MED ORDER — SODIUM CHLORIDE (PF) 0.9 % IJ SOLN
INTRAMUSCULAR | Status: DC | PRN
  Administered 2023-11-21: 61 mL

## 2023-11-21 MED ORDER — METHOCARBAMOL 500 MG PO TABS: 500.0000 mg | ORAL_TABLET | Freq: Four times a day (QID) | ORAL | 1 refills | Status: AC | PRN

## 2023-11-21 MED ORDER — TRANEXAMIC ACID-NACL 1000-0.7 MG/100ML-% IV SOLN
1000.0000 mg | Freq: Once | INTRAVENOUS | Status: AC
  Administered 2023-11-21: 1000 mg via INTRAVENOUS

## 2023-11-21 MED ORDER — PHENYLEPHRINE HCL-NACL 20-0.9 MG/250ML-% IV SOLN
INTRAVENOUS | Status: DC | PRN
  Administered 2023-11-21: 50 ug/min via INTRAVENOUS

## 2023-11-21 MED ORDER — TRANEXAMIC ACID-NACL 1000-0.7 MG/100ML-% IV SOLN
INTRAVENOUS | Status: AC
  Filled 2023-11-21: qty 100

## 2023-11-21 MED ORDER — BUPIVACAINE-EPINEPHRINE (PF) 0.25% -1:200000 IJ SOLN
INTRAMUSCULAR | Status: AC
Start: 2023-11-21 — End: 2023-11-21
  Filled 2023-11-21: qty 30

## 2023-11-21 MED ORDER — AMISULPRIDE (ANTIEMETIC) 5 MG/2ML IV SOLN: 10.0000 mg | Freq: Once | INTRAVENOUS | Status: DC | PRN

## 2023-11-21 MED ORDER — DEXAMETHASONE SODIUM PHOSPHATE 10 MG/ML IJ SOLN
8.0000 mg | Freq: Once | INTRAMUSCULAR | Status: AC
  Administered 2023-11-21: 8 mg via INTRAVENOUS

## 2023-11-21 MED ORDER — 0.9 % SODIUM CHLORIDE (POUR BTL) OPTIME
TOPICAL | Status: DC | PRN
  Administered 2023-11-21: 1000 mL

## 2023-11-21 MED ORDER — ORAL CARE MOUTH RINSE: 15.0000 mL | Freq: Once | OROMUCOSAL | Status: AC

## 2023-11-21 MED ORDER — FENTANYL CITRATE PF 50 MCG/ML IJ SOSY: 25.0000 ug | PREFILLED_SYRINGE | INTRAMUSCULAR | Status: DC | PRN

## 2023-11-21 MED ORDER — EPHEDRINE SULFATE-NACL 50-0.9 MG/10ML-% IV SOSY
PREFILLED_SYRINGE | INTRAVENOUS | Status: DC | PRN
  Administered 2023-11-21: 10 mg via INTRAVENOUS

## 2023-11-21 MED ORDER — FENTANYL CITRATE (PF) 100 MCG/2ML IJ SOLN
INTRAMUSCULAR | Status: AC
  Filled 2023-11-21: qty 2

## 2023-11-21 MED ORDER — PROPOFOL 500 MG/50ML IV EMUL
INTRAVENOUS | Status: DC | PRN
  Administered 2023-11-21: 50 ug/kg/min via INTRAVENOUS

## 2023-11-21 MED ORDER — CEFAZOLIN SODIUM-DEXTROSE 2-4 GM/100ML-% IV SOLN
2.0000 g | INTRAVENOUS | Status: AC
  Administered 2023-11-21: 2 g via INTRAVENOUS
  Filled 2023-11-21: qty 100

## 2023-11-21 MED ORDER — ACETAMINOPHEN 500 MG PO TABS
1000.0000 mg | ORAL_TABLET | Freq: Once | ORAL | Status: AC
  Administered 2023-11-21: 1000 mg via ORAL
  Filled 2023-11-21: qty 2

## 2023-11-21 MED ORDER — STERILE WATER FOR IRRIGATION IR SOLN
Status: DC | PRN
  Administered 2023-11-21: 2000 mL

## 2023-11-21 MED ORDER — LACTATED RINGERS IV BOLUS
500.0000 mL | Freq: Once | INTRAVENOUS | Status: AC
  Administered 2023-11-21: 500 mL via INTRAVENOUS

## 2023-11-21 MED ORDER — FENTANYL CITRATE (PF) 250 MCG/5ML IJ SOLN
INTRAMUSCULAR | Status: DC | PRN
  Administered 2023-11-21 (×2): 50 ug via INTRAVENOUS

## 2023-11-21 SURGICAL SUPPLY — 37 items
BAG COUNTER SPONGE SURGICOUNT (BAG) IMPLANT
BAG ZIPLOCK 12X15 (MISCELLANEOUS) ×1 IMPLANT
BLADE SAG 18X100X1.27 (BLADE) ×1 IMPLANT
COVER PERINEAL POST (MISCELLANEOUS) ×1 IMPLANT
COVER SURGICAL LIGHT HANDLE (MISCELLANEOUS) ×1 IMPLANT
CUP ACET PINNACLE SECTR 56MM (Hips) IMPLANT
DERMABOND ADVANCED .7 DNX12 (GAUZE/BANDAGES/DRESSINGS) ×1 IMPLANT
DRAPE FOOT SWITCH (DRAPES) ×1 IMPLANT
DRAPE STERI IOBAN 125X83 (DRAPES) ×1 IMPLANT
DRAPE U-SHAPE 47X51 STRL (DRAPES) ×2 IMPLANT
DRESSING AQUACEL AG SP 3.5X10 (GAUZE/BANDAGES/DRESSINGS) ×1 IMPLANT
DURAPREP 26ML APPLICATOR (WOUND CARE) ×1 IMPLANT
ELECT REM PT RETURN 15FT ADLT (MISCELLANEOUS) ×1 IMPLANT
GLOVE BIO SURGEON STRL SZ 6 (GLOVE) ×1 IMPLANT
GLOVE BIOGEL PI IND STRL 6.5 (GLOVE) ×1 IMPLANT
GLOVE BIOGEL PI IND STRL 7.5 (GLOVE) ×1 IMPLANT
GLOVE ORTHO TXT STRL SZ7.5 (GLOVE) ×2 IMPLANT
GOWN STRL REUS W/ TWL LRG LVL3 (GOWN DISPOSABLE) ×2 IMPLANT
HEAD CERAMIC DELTA 36 PLUS 1.5 (Hips) IMPLANT
HOLDER FOLEY CATH W/STRAP (MISCELLANEOUS) ×1 IMPLANT
KIT TURNOVER KIT A (KITS) ×1 IMPLANT
MANIFOLD NEPTUNE II (INSTRUMENTS) ×1 IMPLANT
NDL SAFETY ECLIPSE 18X1.5 (NEEDLE) ×1 IMPLANT
PACK ANTERIOR HIP CUSTOM (KITS) ×1 IMPLANT
PENCIL SMOKE EVACUATOR (MISCELLANEOUS) ×1 IMPLANT
PINNACLE ALTRX PLUS 4 N 36X56 (Hips) IMPLANT
SCREW 6.5MMX30MM (Screw) IMPLANT
STEM FEM ACTIS HIGH SZ7 (Stem) IMPLANT
SUT MNCRL AB 4-0 PS2 18 (SUTURE) ×1 IMPLANT
SUT VIC AB 1 CT1 36 (SUTURE) ×3 IMPLANT
SUT VIC AB 2-0 CT1 TAPERPNT 27 (SUTURE) ×2 IMPLANT
SUTURE STRATFX 0 PDS 27 VIOLET (SUTURE) ×1 IMPLANT
SYR 3ML LL SCALE MARK (SYRINGE) ×1 IMPLANT
TOWEL GREEN STERILE FF (TOWEL DISPOSABLE) ×1 IMPLANT
TRAY FOLEY MTR SLVR 16FR STAT (SET/KITS/TRAYS/PACK) ×1 IMPLANT
TUBE SUCTION HIGH CAP CLEAR NV (SUCTIONS) ×1 IMPLANT
WATER STERILE IRR 1000ML POUR (IV SOLUTION) ×1 IMPLANT

## 2023-11-21 NOTE — Transfer of Care (Signed)
 Immediate Anesthesia Transfer of Care Note  Patient: Oscar Valencia  Procedure(s) Performed: ARTHROPLASTY, HIP, TOTAL, ANTERIOR APPROACH (Right: Hip)  Patient Location: PACU  Anesthesia Type:MAC and Spinal  Level of Consciousness: awake, alert , and oriented  Airway & Oxygen Therapy: Patient Spontanous Breathing and Patient connected to face mask oxygen  Post-op Assessment: Report given to RN and Post -op Vital signs reviewed and stable  Post vital signs: Reviewed and stable  Last Vitals:  Vitals Value Taken Time  BP    Temp    Pulse    Resp    SpO2      Last Pain:  Vitals:   11/21/23 0806  TempSrc: Oral  PainSc:          Complications: No notable events documented.

## 2023-11-21 NOTE — Op Note (Signed)
 NAME:  Oscar Valencia NO.: [id number]      MEDICAL RECORD NO.: [id number]      FACILITY:  [facility]      PHYSICIAN:  [name]  DATE OF BIRTH:  [birth year]     DATE OF PROCEDURE:  11/21/2023                                 OPERATIVE REPORT         PREOPERATIVE DIAGNOSIS: Right  hip osteoarthritis.      POSTOPERATIVE DIAGNOSIS:  Right hip osteoarthritis.      PROCEDURE:  Right total hip replacement through an anterior approach   utilizing DePuy THR system, component size 56 mm pinnacle cup, a size 36+4 neutral   Altrex liner, a size 7 Hi Actis stem with a 36+1.5 delta ceramic   ball.      SURGEON:  Donnice JONETTA. Car, M.D.      ASSISTANT:  Randine Ricks, PA-C     ANESTHESIA:  Spinal.      SPECIMENS:  None.      COMPLICATIONS:  None.      BLOOD LOSS:  300 cc     DRAINS:  None.      INDICATION OF THE PROCEDURE:  Oscar Valencia is a 73 y.o. male who had   presented to office for evaluation of right hip pain.  Radiographs revealed   progressive degenerative changes with bone-on-bone   articulation of the  hip joint, including subchondral cystic changes and osteophytes.  The patient had painful limited range of   motion significantly affecting their overall quality of life and function.  The patient was failing to    respond to conservative measures including medications and/or injections and activity modification and at this point was ready   to proceed with more definitive measures.  Consent was obtained for   benefit of pain relief.  Specific risks of infection, DVT, component   failure, dislocation, neurovascular injury, and need for revision surgery were reviewed in the office.     PROCEDURE IN DETAIL:  The patient was brought to operative theater.   Once adequate anesthesia, preoperative antibiotics, 2 gm of Ancef , 1 gm of Tranexamic Acid , and 10 mg of Decadron  were administered, the patient was positioned supine on the [employer]  table.  Once the patient was safely positioned with adequate padding of boney prominences we predraped out the hip, and used fluoroscopy to confirm orientation of the pelvis.      The right hip was then prepped and draped from proximal iliac crest to   mid thigh with a shower curtain technique.      Time-out was performed identifying the patient, planned procedure, and the appropriate extremity.     An incision was then made 2 cm lateral to the   anterior superior iliac spine extending over the orientation of the   tensor fascia lata muscle and sharp dissection was carried down to the   fascia of the muscle.      The fascia was then incised.  The muscle belly was identified and swept   laterally and retractor placed along the superior neck.  Following   cauterization of the circumflex vessels and removing some pericapsular   fat, a second cobra retractor was placed on the inferior  neck.  A T-capsulotomy was made along the line of the   superior neck to the trochanteric fossa, then extended proximally and   distally.  Tag sutures were placed and the retractors were then placed   intracapsular.  We then identified the trochanteric fossa and   orientation of my neck cut and then made a neck osteotomy with the femur on traction.  The femoral   head was removed without difficulty or complication.  Traction was let   off and retractors were placed posterior and anterior around the   acetabulum.      The labrum and foveal tissue were debrided.  I began reaming with a 49 mm   reamer and reamed up to 55 mm reamer with good bony bed preparation and a 56 mm  cup was chosen.  The final 56 mm Pinnacle cup was then impacted under fluoroscopy to confirm the depth of penetration and orientation with respect to   Abduction and forward flexion.  A screw was placed into the ilium followed by the hole eliminator.  The final   36+4 neutral Altrex liner was impacted with good visualized rim fit.  The cup was  positioned anatomically within the acetabular portion of the pelvis.      At this point, the femur was rolled to 100 degrees.  Further capsule was   released off the inferior aspect of the femoral neck.  I then   released the superior capsule proximally.  With the leg in a neutral position the hook was placed laterally   along the femur under the vastus lateralis origin and elevated manually and then held in position using the hook attachment on the bed.  The leg was then extended and adducted with the leg rolled to 100   degrees of external rotation.  Retractors were placed along the medial calcar and posteriorly over the greater trochanter.  Once the proximal femur was fully   exposed, I used a box osteotome to set orientation.  I then began   broaching with the starting chili pepper broach and passed this by hand and then broached up to 7.  With the 7 broach in place I chose a high offset neck and did several trial reductions.  The offset was appropriate, leg lengths   appeared to be equal best matched with the +1.5 head ball trial confirmed radiographically.   Given these findings, I went ahead and dislocated the hip, repositioned all   retractors and positioned the right hip in the extended and abducted position.  The final 7 Hi Actis stem was   chosen and it was impacted down to the level of neck cut.  Based on this   and the trial reductions, a final 36+1.5 delta ceramic ball was chosen and   impacted onto a clean and dry trunnion, and the hip was reduced.  The   hip had been irrigated throughout the case again at this point.  I did   reapproximate the superior capsular leaflet to the anterior leaflet   using #1 Vicryl.  The fascia of the   tensor fascia lata muscle was then reapproximated using #1 Vicryl and #0 Stratafix sutures.  The   remaining wound was closed with 2-0 Vicryl and running 4-0 Monocryl.   The hip was cleaned, dried, and dressed sterilely using Dermabond and   Aquacel  dressing.  The patient was then brought   to recovery room in stable condition tolerating the procedure well.   Randine Ricks,  PA-C was present for the entirety of the case involved from   preoperative positioning, perioperative retractor management, general   facilitation of the case, as well as primary wound closure as assistant.            Donnice CORDOBA Ernie, M.D.        11/21/2023 8:45 AM

## 2023-11-21 NOTE — Discharge Instructions (Signed)

## 2023-11-21 NOTE — Anesthesia Procedure Notes (Signed)
 Spinal  Patient location during procedure: OR Start time: 11/21/2023 9:57 AM End time: 11/21/2023 10:00 AM Reason for block: surgical anesthesia Staffing Performed: resident/CRNA  Resident/CRNA: Nanci Riis, CRNA Performed by: Nanci Riis, CRNA Authorized by: Niels Marien CROME, MD   Preanesthetic Checklist Completed: patient identified, IV checked, site marked, risks and benefits discussed, surgical consent, monitors and equipment checked, pre-op evaluation and timeout performed Spinal Block Patient position: sitting Prep: DuraPrep Patient monitoring: heart rate, cardiac monitor, continuous pulse ox and blood pressure Approach: midline Location: L3-4 Injection technique: single-shot Needle Needle type: Sprotte  Needle gauge: 24 G Needle length: 9 cm Assessment Sensory level: T4 Events: CSF return

## 2023-11-21 NOTE — Evaluation (Signed)
 Physical Therapy Evaluation Patient Details Name: Oscar Valencia MRN: [id number] DOB: 15-Jul-1950 Today's Date: 11/21/2023  History of Present Illness  Pt is a 73 year old male s/p R THA 11/21/23. PMH: pancreatic CA s/p surgical excision, L UKA  Clinical Impression  Pt is s/p THA resulting in the deficits listed below (see PT Problem List). Pt in bed, agreeable to session, denies pain at this time. Pt able to come to stand, well tolerated. Progresses to 159ft amb with RW at CGA-supervision A. Voids bladder in bathroom. Pt is able to demonstrate steps per home setup with safe and effective technique using SPC to assist non rail side UE. HEP provided and reviewed, pt demonstrates and verbalizes understanding.         If plan is discharge home, recommend the following: A little help with walking and/or transfers;A little help with bathing/dressing/bathroom;Assistance with cooking/housework;Help with stairs or ramp for entrance;Assist for transportation   Can travel by private vehicle        Equipment Recommendations None recommended by PT  Recommendations for Other Services       Functional Status Assessment Patient has had a recent decline in their functional status and demonstrates the ability to make significant improvements in function in a reasonable and predictable amount of time.     Precautions / Restrictions Precautions Precautions: Fall Recall of Precautions/Restrictions: Intact Restrictions Weight Bearing Restrictions Per Provider Order: Yes RLE Weight Bearing Per Provider Order: Weight bearing as tolerated      Mobility  Bed Mobility Overal bed mobility: Modified Independent             General bed mobility comments: inc time    Transfers Overall transfer level: Modified independent Equipment used: Rolling walker (2 wheels)               General transfer comment: with RW, able to self correct on RLE position for transfer     Ambulation/Gait Ambulation/Gait assistance: Supervision Gait Distance (Feet): 100 Feet Assistive device: Rolling walker (2 wheels) Gait Pattern/deviations: Step-to pattern, Decreased step length - left, Decreased stance time - right, Antalgic Gait velocity: dec     General Gait Details: Pt amb with inc apprehension to fully WB on RLE, decreased stance time as a result, able to demonstrate safe mobility x190ft  Stairs Stairs: Yes Stairs assistance: Supervision Stair Management: One rail Left Number of Stairs: 2 General stair comments: Pt uses SPC in RUE and L HR to ascend steps and appropriately descends with non reciprocal pattern no LOB. Pt educated on holding using stairs inside the home until he feels safe, wife unable to physically assist  Wheelchair Mobility     Tilt Bed    Modified Rankin (Stroke Patients Only)       Balance Overall balance assessment: Needs assistance Sitting-balance support: Feet supported, No upper extremity supported Sitting balance-Leahy Scale: Good     Standing balance support: During functional activity, No upper extremity supported Standing balance-Leahy Scale: Fair Standing balance comment: Pt able to use bathroom and wash hands without UE assist                             Pertinent Vitals/Pain Pain Assessment Pain Assessment: No/denies pain    Home Living Family/patient expects to be discharged to:: Private residence Living Arrangements: Spouse/significant other Available Help at Discharge: Family;Available 24 hours/day Type of Home: House Home Access: Stairs to enter Entrance Stairs-Rails: Left Entrance Stairs-Number of Steps:  3 Alternate Level Stairs-Number of Steps: 12 Home Layout: Multi-level;Able to live on main level with bedroom/bathroom Home Equipment: Rolling Walker (2 wheels);Cane - single point      Prior Function Prior Level of Function : Independent/Modified Independent                      Extremity/Trunk Assessment   Upper Extremity Assessment Upper Extremity Assessment: Overall WFL for tasks assessed    Lower Extremity Assessment Lower Extremity Assessment: RLE deficits/detail RLE Deficits / Details: consistent with post operative status    Cervical / Trunk Assessment Cervical / Trunk Assessment: Normal  Communication   Communication Communication: No apparent difficulties    Cognition Arousal: Alert Behavior During Therapy: WFL for tasks assessed/performed   PT - Cognitive impairments: No apparent impairments                         Following commands: Intact       Cueing Cueing Techniques: Verbal cues, Gestural cues     General Comments General comments (skin integrity, edema, etc.): voids bladder    Exercises Total Joint Exercises Ankle Circles/Pumps: AROM, 10 reps, Seated Quad Sets: AROM, 10 reps, Seated Short Arc Quad: AROM, 10 reps, Seated Heel Slides: AROM, 5 reps, Seated Hip ABduction/ADduction: AROM, 5 reps, Seated, Standing Long Arc Quad: AROM, 10 reps, Seated Knee Flexion: AROM, 10 reps, Seated, Standing Marching in Standing: AROM, 10 reps, Standing Standing Hip Extension: AROM, 10 reps, Standing   Assessment/Plan    PT Assessment Patient does not need any further PT services  PT Problem List         PT Treatment Interventions      PT Goals (Current goals can be found in the Care Plan section)  Acute Rehab PT Goals Patient Stated Goal: return home and continue to progress PT Goal Formulation: With patient Time For Goal Achievement: 12/05/23 Potential to Achieve Goals: Good    Frequency       Co-evaluation               AM-PAC PT 6 Clicks Mobility  Outcome Measure Help needed turning from your back to your side while in a flat bed without using bedrails?: None Help needed moving from lying on your back to sitting on the side of a flat bed without using bedrails?: None Help needed moving to and  from a bed to a chair (including a wheelchair)?: None Help needed standing up from a chair using your arms (e.g., wheelchair or bedside chair)?: None Help needed to walk in hospital room?: A Little Help needed climbing 3-5 steps with a railing? : A Little 6 Click Score: 22    End of Session Equipment Utilized During Treatment: Gait belt Activity Tolerance: Patient tolerated treatment well Patient left: in chair;with call bell/phone within reach;with nursing/sitter in room Nurse Communication: Mobility status PT Visit Diagnosis: Other abnormalities of gait and mobility (R26.89)    Time: 8598-8555 PT Time Calculation (min) (ACUTE ONLY): 43 min   Charges:   PT Evaluation $PT Eval Low Complexity: 1 Low PT Treatments $Gait Training: 8-22 mins $Therapeutic Exercise: 8-22 mins PT General Charges $$ ACUTE PT VISIT: 1 Visit         [facility] Acute Rehabilitation Services Office: [phone] 11/21/2023   Stann DELENA Ohara 11/21/2023, 3:01 PM

## 2023-11-21 NOTE — H&P (Signed)
 TOTAL HIP ADMISSION H&P  Patient is admitted for right total hip arthroplasty.  Therapy Plans: HEP Disposition: Home with wife Planned DVT Prophylaxis: aspirin  81mg  BID DME needed: none PCP: Dr. Loreli - will ask for form TXA: IV Allergies: PCN Anesthesia Concerns: none BMI: 24.4 Last HgbA1c:   Other: - SDD (Take out catheter prior to him waking up)  - No hx of VTE - Hx of pancreatic cancer s/p surgical excision - 3 years ago - oxycodone  (do not send), robaxin , tylenol , celebrex     Subjective:  Chief Complaint: right hip pain  HPI: Oscar Valencia, 73 y.o. male, has a history of pain and functional disability in the right hip(s) due to arthritis and patient has failed non-surgical conservative treatments for greater than 12 weeks to include NSAID's and/or analgesics and activity modification.  Onset of symptoms was abrupt starting 1 years ago with gradually worsening course since that time.The patient noted no past surgery on the right hip(s).  Patient currently rates pain in the right hip at 8 out of 10 with activity. Patient has night pain, worsening of pain with activity and weight bearing, pain that interfers with activities of daily living, and pain with passive range of motion. Patient has evidence of joint space narrowing by imaging studies. This condition presents safety issues increasing the risk of falls. There is no current active infection.  Patient Active Problem List   Diagnosis Date Noted   S/P right unicompartmental knee replacement 06/11/2023   Prostate cancer (HCC) 08/03/2019   Malignant neoplasm of prostate (HCC) 06/12/2019   CHEST PAIN, EXERTIONAL 03/16/2009   COUGH 09/26/2006   Past Medical History:  Diagnosis Date   Arthritis    Cataract    surgery to remove   GERD (gastroesophageal reflux disease)    Hyperlipidemia    Prostate cancer (HCC)    Skin cancer    UTI (urinary tract infection)     Past Surgical History:  Procedure Laterality Date    cataracts Bilateral    eye surgery   COLONOSCOPY  2010   mild tics, no polyps   EYE SURGERY     JOINT REPLACEMENT     left hand surgery     ring finger - repaired tendon   LYMPHADENECTOMY Bilateral 08/03/2019   Procedure: LYMPHADENECTOMY, PELVIC;  Surgeon: Renda Glance, MD;  Location: [facility];  Service: Urology;  Laterality: Bilateral;   PARTIAL KNEE ARTHROPLASTY Right 06/11/2023   Procedure: ARTHROPLASTY, KNEE, UNICOMPARTMENTAL;  Surgeon: Ernie Cough, MD;  Location: [facility];  Service: Orthopedics;  Laterality: Right;   PROSTATE BIOPSY  04/30/2019   cancerous   REPLACEMENT TOTAL KNEE Left    ROBOT ASSISTED LAPAROSCOPIC RADICAL PROSTATECTOMY N/A 08/03/2019   Procedure: XI ROBOTIC ASSISTED LAPAROSCOPIC RADICAL PROSTATECTOMY LEVEL 2; FLEXIBLE CYSTOSCOPY;  Surgeon: Renda Glance, MD;  Location: [facility];  Service: Urology;  Laterality: N/A;    No current facility-administered medications for this encounter.   Current Outpatient Medications  Medication Sig Dispense Refill Last Dose/Taking   atorvastatin  (LIPITOR) 20 MG tablet Take 20 mg by mouth at bedtime.      celecoxib  (CELEBREX ) 200 MG capsule Take 200 mg by mouth 2 (two) times daily.      ondansetron  (ZOFRAN -ODT) 4 MG disintegrating tablet Take 4 mg by mouth every 8 (eight) hours as needed for nausea or vomiting.      oxyCODONE  (OXY IR/ROXICODONE ) 5 MG immediate release tablet Take 5 mg by mouth every 4 (four) hours as needed (pain.).  No Active Allergies  Social History   Tobacco Use   Smoking status: Former    Current packs/day: 0.00    Average packs/day: 0.5 packs/day for 10.0 years (5.0 ttl pk-yrs)    Types: Cigarettes    Start date: 32    Quit date: 90    Years since quitting: 32.7   Smokeless tobacco: Never  Substance Use Topics   Alcohol  use: Yes    Alcohol /week: 2.0 standard drinks of alcohol     Types: 2 Glasses of wine per week    Comment: wine 14 per week    Family History  Problem Relation Age of Onset    Colon polyps Father    Prostate cancer Father    Skin cancer Mother    Colon cancer Neg Hx    Rectal cancer Neg Hx    Stomach cancer Neg Hx    Breast cancer Neg Hx    Esophageal cancer Neg Hx      Review of Systems  Constitutional:  Negative for chills and fever.  Respiratory:  Negative for cough and shortness of breath.   Cardiovascular:  Negative for chest pain.  Gastrointestinal:  Negative for nausea and vomiting.  Musculoskeletal:  Positive for arthralgias.     Objective:  Physical Exam Right knee exam: His surgical incision remains well-healed without signs of infection He has full knee extension and flexes 220 degrees. He does have some mild crepitation over the anterior medial aspect of the knee but this is similar to his left knee with similar history of partial knee arthroplasty.  Right hip exam: Based on the nature of his complaint today we evaluated his right hip. In comparing his range of motion to his left hip he does have reproducible anterior based hip pain with hip flexion internal rotation over 5 degrees with some pelvic tilting with external rotation close to 30 degrees. In comparison his left hip moves more fluidly with mild tightness without reproducible pain  Vital signs in last 24 hours:    Labs:   Estimated body mass index is 24.11 kg/m as calculated from the following:   Height as of 11/14/23: 5' 10 (1.778 m).   Weight as of 11/14/23: 76.2 kg.   Imaging Review Plain radiographs demonstrate severe degenerative joint disease of the right hip(s). The bone quality appears to be adequate for age and reported activity level.      Assessment/Plan:  End stage arthritis, right hip(s)  The patient history, physical examination, clinical judgement of the provider and imaging studies are consistent with end stage degenerative joint disease of the right hip(s) and total hip arthroplasty is deemed medically necessary. The treatment options including  medical management, injection therapy, arthroscopy and arthroplasty were discussed at length. The risks and benefits of total hip arthroplasty were presented and reviewed. The risks due to aseptic loosening, infection, stiffness, dislocation/subluxation,  thromboembolic complications and other imponderables were discussed.  The patient acknowledged the explanation, agreed to proceed with the plan and consent was signed. Patient is being admitted for inpatient treatment for surgery, pain control, PT, OT, prophylactic antibiotics, VTE prophylaxis, progressive ambulation and ADL's and discharge planning.The patient is planning to be discharged home.   Rosina Calin, PA-C Orthopedic Surgery [facility] [place] [phone]

## 2023-11-21 NOTE — Anesthesia Preprocedure Evaluation (Addendum)
 Anesthesia Evaluation  Patient identified by MRN, date of birth, ID band Patient awake    Reviewed: Allergy & Precautions, NPO status , Patient's Chart, lab work & pertinent test results  Airway Mallampati: II  TM Distance: >3 FB Neck ROM: Full    Dental no notable dental hx. (+) Teeth Intact, Dental Advisory Given   Pulmonary former smoker   Pulmonary exam normal breath sounds clear to auscultation       Cardiovascular negative cardio ROS Normal cardiovascular exam Rhythm:Regular Rate:Normal     Neuro/Psych negative neurological ROS  negative psych ROS   GI/Hepatic Neg liver ROS,GERD  ,,  Endo/Other  negative endocrine ROS    Renal/GU negative Renal ROS  negative genitourinary   Musculoskeletal  (+) Arthritis ,    Abdominal   Peds  Hematology negative hematology ROS (+)   Anesthesia Other Findings   Reproductive/Obstetrics                              Anesthesia Physical Anesthesia Plan  ASA: 2  Anesthesia Plan: Spinal   Post-op Pain Management: Tylenol  PO (pre-op)*   Induction:   PONV Risk Score and Plan: 1 and Treatment may vary due to age or medical condition, Propofol  infusion, Dexamethasone  and Ondansetron   Airway Management Planned: Natural Airway  Additional Equipment:   Intra-op Plan:   Post-operative Plan:   Informed Consent: I have reviewed the patients History and Physical, chart, labs and discussed the procedure including the risks, benefits and alternatives for the proposed anesthesia with the patient or authorized representative who has indicated his/her understanding and acceptance.     Dental advisory given  Plan Discussed with: CRNA  Anesthesia Plan Comments:          Anesthesia Quick Evaluation

## 2023-11-21 NOTE — Interval H&P Note (Signed)
 History and Physical Interval Note:  11/21/2023 8:45 AM  Oscar Valencia  has presented today for surgery, with the diagnosis of Right hip osteoarthritis.  The various methods of treatment have been discussed with the patient and family. After consideration of risks, benefits and other options for treatment, the patient has consented to  Procedure(s): ARTHROPLASTY, HIP, TOTAL, ANTERIOR APPROACH (Right) as a surgical intervention.  The patient's history has been reviewed, patient examined, no change in status, stable for surgery.  I have reviewed the patient's chart and labs.  Questions were answered to the patient's satisfaction.     [name]

## 2023-11-21 NOTE — Care Plan (Signed)
 Ortho [facility] Case Management Note  Patient Details  Name: RANJIT ASHURST MRN: [id number] Date of Birth: 03-27-1950  RT THA on 11/21/23  DCP: Home with wife  DME: No needs  PT: HEP                   DME Arranged:  N/A DME Agency:  NA  HH Arranged:    HH Agency:     Additional Comments: Please contact me with any questions of if this plan should need to change.  Burnard Dross, Case Manager [facility] [phone]  Ext. [phone]   11/21/2023, 8:33 AM

## 2023-11-22 ENCOUNTER — Encounter (HOSPITAL_COMMUNITY): Payer: Self-pay | Admitting: Orthopedic Surgery

## 2023-11-22 NOTE — Anesthesia Postprocedure Evaluation (Signed)
 Anesthesia Post Note  Patient: Sha W Rivere  Procedure(s) Performed: ARTHROPLASTY, HIP, TOTAL, ANTERIOR APPROACH (Right: Hip)     Patient location during evaluation: PACU Anesthesia Type: Spinal Level of consciousness: oriented and awake and alert Pain management: pain level controlled Vital Signs Assessment: post-procedure vital signs reviewed and stable Respiratory status: spontaneous breathing, respiratory function stable and patient connected to nasal cannula oxygen Cardiovascular status: blood pressure returned to baseline and stable Postop Assessment: no headache, no backache and no apparent nausea or vomiting Anesthetic complications: no   No notable events documented.  Last Vitals:  Vitals:   11/21/23 1345 11/21/23 1445  BP: 131/66 (!) 144/78  Pulse: (!) 53 (!) 55  Resp: 20   Temp:    SpO2: 97% 99%    Last Pain:  Vitals:   11/21/23 1445  TempSrc:   PainSc: 0-No pain                 Marina Boerner L Tearia Gibbs

## 2024-01-08 DIAGNOSIS — Z5189 Encounter for other specified aftercare: Secondary | ICD-10-CM | POA: Diagnosis not present

## 2024-03-18 ENCOUNTER — Other Ambulatory Visit: Payer: Self-pay | Admitting: Medical Genetics

## 2024-03-19 ENCOUNTER — Other Ambulatory Visit

## 2024-03-19 DIAGNOSIS — Z006 Encounter for examination for normal comparison and control in clinical research program: Secondary | ICD-10-CM
